# Patient Record
Sex: Male | Born: 1950 | Race: White | Hispanic: No | Marital: Married | Smoking: Former smoker
Health system: Southern US, Community
[De-identification: ages and names within clinical notes are randomized; demographics above are authoritative.]

## PROBLEM LIST (undated history)

## (undated) DIAGNOSIS — I10 Essential (primary) hypertension: Secondary | ICD-10-CM

## (undated) DIAGNOSIS — K219 Gastro-esophageal reflux disease without esophagitis: Secondary | ICD-10-CM

## (undated) HISTORY — PX: COLONOSCOPY: SHX174

---

## 2004-05-22 ENCOUNTER — Ambulatory Visit: Payer: Self-pay | Admitting: Internal Medicine

## 2004-05-22 ENCOUNTER — Ambulatory Visit (HOSPITAL_COMMUNITY): Admission: RE | Admit: 2004-05-22 | Discharge: 2004-05-22 | Payer: Self-pay | Admitting: Internal Medicine

## 2005-08-28 ENCOUNTER — Ambulatory Visit: Payer: Self-pay | Admitting: Internal Medicine

## 2007-05-09 ENCOUNTER — Emergency Department (HOSPITAL_COMMUNITY): Admission: EM | Admit: 2007-05-09 | Discharge: 2007-05-09 | Payer: Self-pay | Admitting: Emergency Medicine

## 2007-05-10 ENCOUNTER — Inpatient Hospital Stay (HOSPITAL_COMMUNITY): Admission: EM | Admit: 2007-05-10 | Discharge: 2007-05-12 | Payer: Self-pay | Admitting: Emergency Medicine

## 2010-07-17 NOTE — Op Note (Signed)
NAME:  Javier Lee, Javier Lee            ACCOUNT NO.:  [id number]   MEDICAL RECORD NO.:  [id number]          PATIENT TYPE:  INP   LOCATION:  5041                         FACILITY:  [facility]   PHYSICIAN:  John C. Madilyn Fireman, M.D.    DATE OF BIRTH:  [birth year]   DATE OF PROCEDURE:  05/10/2007  DATE OF DISCHARGE:                               OPERATIVE REPORT   PROCEDURE:  Esophagogastroduodenoscopy.   INDICATIONS FOR PROCEDURE:  Gastrointestinal bleeding after  esophagogastroduodenoscopy with removal of foreign body from the  esophagus yesterday.   PROCEDURE:  The patient was placed in the left lateral decubitus  position and placed on the pulse monitor with continuous low-flow oxygen  delivered by nasal cannula.  He was sedated with 75 mcg IV ___________ ,  6 mg IV Versed and 25 mg IV Phenergan.  The Olympus video endoscope was  advanced under direct vision into the oropharynx and esophagus.  The  esophagus was straight and of normal caliber with the squamocolumnar  line at 38 cm.  There was a ring seen as before.  The stomach was  entered and there was a lot of black old blood in the dependent portions  of fundus and one or two blackish clots.  There was no red blood seen.  The scope was advanced down into the distal stomach where the mucosa was  better seen.  There was no distal ulcer, pylorus was not deformed and  easily allowed passage of the endoscope tip into the duodenum.  Both  bulb and second portion were inspected and appeared to be within normal  limits.  Scope was drawn back into the stomach and attention was given  toward the GE junction and proximal stomach.  A lot of suctioning and  lavage was obtained and there was some blackish material, some of which  was adherent to the stomach wall which made visualization of fine detail  difficult.  However, no active bleeding was seen on prograde and  retroflexed view of the cardia.  I did not distinctly see a bleeding  lesion even though  there was a small area between the hiatus and the  ring that was somewhat difficult to see and I suspected there was  probably a laceration from yesterday's procedure.  However, no bleeding  was seen and no adherent blood clot or visible vessel was seen either.  Since that there appeared to be no further active bleeding, no therapy  was performed.  The scope was then withdrawn and the patient returned to  the recovery room in stable condition.  She tolerated the procedure  well.  There were no immediate complications.   IMPRESSION:  Lower esophageal ring with small hiatal hernia and old  blood in the stomach indicating recent active bleeding probably from  near the gastroesophageal junction.   PLAN:  Will keep him in the hospital overnight for observation to  observe for ongoing bleeding.  Otherwise treat with proton pump  inhibitor.           ______________________________  Everardo All. Madilyn Fireman, M.D.     JCH/MEDQ  D:  05/10/2007  T:  05/11/2007  Job:  [id number]

## 2010-07-17 NOTE — Op Note (Signed)
NAME:  STEVENS, MAGWOOD NO.:  [id number]   MEDICAL RECORD NO.:  [id number]          PATIENT TYPE:  EMS   LOCATION:  MAJO                         FACILITY:  [facility]   PHYSICIAN:  John C. Madilyn Fireman, M.D.    DATE OF BIRTH:  06-24-1950   DATE OF PROCEDURE:  05/09/2007  DATE OF DISCHARGE:                               OPERATIVE REPORT   PROCEDURE:  Esophagogastroduodenoscopy with foreign body removal.   INDICATIONS FOR PROCEDURE:  Sensation of food impaction with inability  to swallow liquids or saliva since eating steak yesterday evening.   HISTORY OF PRESENT ILLNESS:  The patient is a 60 year old white male in  good health except for hypertension but who has had intermittent solid  food dysphagia in the past and has had an esophageal dilatation about 5  years ago.  He has had worsening intermittent solid food dysphagia  recently and had a sensation of complete impaction after eating steak  last night which persisted through night and did not respond to  glucagon.  He has spitting in the emesis basin since he arrived at the  emergency room.   PROCEDURE:  The patient was placed in the left lateral decubitus  position and placed on the pulse monitor with continuous low-flow oxygen  delivered by nasal cannula.  He was sedated with 100 mcg IV fentanyl and  10 mg IV Versed and 12.5 mcg IV fentanyl.  The Olympus video endoscope  was advanced under direct vision in the oropharynx and esophagus.  The  esophagus was straight and of normal caliber.  The distal esophagus was  obscured by significant food bolus.  This was pushed through the GE  junction with gentle pressure which did result in some bleeding and  revealed an underlying friable lower esophageal ring with a short hiatal  hernia.  The stomach was entered, and a small amount of liquid  secretions as well as the food that had been pushed through was seen.  I  did not approach the pylorus.  I did not see any evidence of  malignancy  and did not see any ulcers.  No dilatation was done.  The scope was then  withdrawn and the patient returned to the recovery room in stable  condition.  He tolerated the procedure well, and there were no immediate  complications.   IMPRESSION:  1. Foreign body in the esophagus dislodged.  2. Lower esophageal ring.   PLAN:  Continue proton pump inhibitor.  We will see in the office in 3-4  weeks and set up elective dilatation.           ______________________________  Everardo All Madilyn Fireman, M.D.     JCH/MEDQ  D:  05/09/2007  T:  05/11/2007  Job:  [id number]

## 2010-07-17 NOTE — H&P (Signed)
NAME:  Javier Lee, Javier Lee            ACCOUNT NO.:  [id number]   MEDICAL RECORD NO.:  [id number]          PATIENT TYPE:  INP   LOCATION:  5041                         FACILITY:  [facility]   PHYSICIAN:  John C. Madilyn Fireman, M.D.    DATE OF BIRTH:  1951/02/20   DATE OF ADMISSION:  05/10/2007  DATE OF DISCHARGE:                              HISTORY & PHYSICAL   CHIEF COMPLAINT:  Hematemesis.   HISTORY OF PRESENT ILLNESS:  The patient is a 60 year old white male who  presented with a food impaction in his esophagus from steak yesterday.  This was disimpacted by pushing the bolus through the GE junction with  an underlying esophageal ring seen.  Later that evening, he  began  having nausea, weakness and a near syncopal episode and black stools.  He came to the emergency room today where he was found to have elevated  BUN of 49, creatinine 0.88 with a hemoglobin of 12.5.  he also had  orthostatic pulse and blood pressure changes with pulse going from 89-  145 when standing.  EGD today showed old coffee ground material in the  stomach with irritation at the GE junction with no active bleeding.  No  other obvious bleeding source.  He is being admitted for further care of  GI bleed.   PAST MEDICAL HISTORY:  1. Hypertension.  2. Anxiety.  3. Gastroesophageal reflux.   MEDICATIONS:  Prilosec, Toprol XL, Vytorin.   FAMILY HISTORY:  Noncontributory.   PHYSICAL EXAMINATION:  HEART:  Regular rate and rhythm with sinus  tachycardia.  ABDOMEN:  Soft, nondistended with normoactive bowel sounds, no  hepatosplenomegaly, mass or guarding.   LABORATORY DATA:  As mentioned above.   IMPRESSION:  Gastrointestinal bleeding related to the esophageal food  impaction and therapy to dislodge it.   PLAN:  Observation admission for now.  Will treat with proton pump  inhibitor, monitor for further bleeding and transfuse as necessary.           ______________________________  Everardo All Madilyn Fireman, M.D.     JCH/MEDQ   D:  05/10/2007  T:  05/11/2007  Job:  [id number]

## 2010-07-20 NOTE — Op Note (Signed)
NAME:  Javier Lee, Javier Lee            ACCOUNT NO.:  [id number]   MEDICAL RECORD NO.:  [id number]          PATIENT TYPE:  AMB   LOCATION:  DAY                           FACILITY:  [facility]   PHYSICIAN:  R. Roetta Sessions, M.D. DATE OF BIRTH:  05-16-1950   DATE OF PROCEDURE:  05/22/2004  DATE OF DISCHARGE:                                 OPERATIVE REPORT   PROCEDURE:  Esophagogastroduodenoscopy with Elease Hashimoto dilation.   INDICATIONS FOR PROCEDURE:  The patient is 60 year old gentleman with  history of GERD and esophageal dysphagia, known history of Schatzki's ring  who underwent dilation by me at [facility] back in 1998. He has done  well as far as his reflux symptoms being controlled on Prilosec 20 mg orally  daily. He has had recurrent intermittent esophageal dysphagia to solids. EGD  is now being done. This approach has been discussed with patient at length  along with potential risks, benefits, and alternatives. His questions were  answered. He is agreeable. Please see documentation in the medical record.   PROCEDURE:  O2 saturation, blood pressure, pulse, and respirations were  monitored throughout the entire procedure. Conscious sedation with Versed 5  mg IV and Demerol 125 mg in divided doses.   INSTRUMENT:  Olympus video chip system.   FINDINGS:  Examination of the tubular esophagus revealed a Schatzki's ring.  It was not critical appearing, but it was very much present. Esophageal  mucosa otherwise appeared normal. EG junction easily transversed with a  scope.   Stomach:  The gastric cavity was empty and insufflated well with air.  Thorough examination of the gastric mucosa including retroflexed view of the  proximal stomach and esophagogastric junction demonstrated only a moderate  sized hiatal hernia. Pylorus was patent and easily transversed. Examination  of bulb and second portion revealed no abnormalities.   THERAPY/DIAGNOSTIC MANEUVERS:  A 58-French Maloney dilator is  passed to full  insertion. A look back revealed persistence of the ring. It was disrupted  with four quadrant bites with the biopsy forceps. The patient tolerated the  procedure well and was reactive in endoscopy.   IMPRESSION:  1.  Schatzki's ring, otherwise normal esophagus, status post      dilation/disruption as described above.  2.  Moderate sized hiatal hernia, otherwise normal stomach, normal D1 and      D2.   RECOMMENDATIONS:  1.  Continue Prilosec 20 mg orally daily.  2.  Anti-reflux measures/literature provided.  3.  Follow up on a p.r.n. basis.      RMR/MEDQ  D:  05/22/2004  T:  05/22/2004  Job:  [id number]

## 2010-11-26 LAB — I-STAT 8, (EC8 V) (CONVERTED LAB)
Bicarbonate: 25 — ABNORMAL HIGH
HCT: 52
Hemoglobin: 17.7 — ABNORMAL HIGH
Operator id: 133351
Sodium: 137
TCO2: 26
pCO2, Ven: 34.7 — ABNORMAL LOW

## 2010-11-26 LAB — CBC
HCT: 26.8 — ABNORMAL LOW
HCT: 36 — ABNORMAL LOW
Hemoglobin: 12.5 — ABNORMAL LOW
Hemoglobin: 16.7
Hemoglobin: 9.3 — ABNORMAL LOW
MCHC: 34.5
MCHC: 34.7
MCV: 87.7
Platelets: 183
Platelets: 184
Platelets: 202
Platelets: 230
RBC: 3.05 — ABNORMAL LOW
RBC: 3.27 — ABNORMAL LOW
RDW: 12.7
RDW: 12.7
WBC: 4.8
WBC: 9.3

## 2010-11-26 LAB — BASIC METABOLIC PANEL
BUN: 25 — ABNORMAL HIGH
BUN: 49 — ABNORMAL HIGH
CO2: 26
Calcium: 9.1
Chloride: 105
Creatinine, Ser: 0.87
GFR calc Af Amer: >60
GFR calc Af Amer: >60
GFR calc non Af Amer: >60
GFR calc non Af Amer: >60
GFR calc non Af Amer: >60
Glucose, Bld: 124 — ABNORMAL HIGH
Potassium: 4.1
Sodium: 140
Sodium: 140

## 2010-11-26 LAB — DIFFERENTIAL
Basophils Absolute: 0
Basophils Absolute: 0
Basophils Relative: 0
Basophils Relative: 0
Eosinophils Relative: 0
Lymphocytes Relative: 10 — ABNORMAL LOW
Lymphocytes Relative: 15
Monocytes Absolute: 0.5
Monocytes Absolute: 0.6
Neutro Abs: 8 — ABNORMAL HIGH
Neutro Abs: 9.9 — ABNORMAL HIGH
Neutrophils Relative %: 79 — ABNORMAL HIGH

## 2010-11-26 LAB — POCT I-STAT CREATININE
Creatinine, Ser: 0.9
Operator id: 133351

## 2010-11-26 LAB — OCCULT BLOOD X 1 CARD TO LAB, STOOL: Fecal Occult Bld: POSITIVE

## 2020-07-03 ENCOUNTER — Emergency Department (HOSPITAL_COMMUNITY)
Admission: EM | Admit: 2020-07-03 | Discharge: 2020-07-03 | Disposition: A | Discharge status: Home / Self Care | Payer: Medicare PPO | Attending: Emergency Medicine | Admitting: Emergency Medicine

## 2020-07-03 ENCOUNTER — Other Ambulatory Visit: Payer: Self-pay

## 2020-07-03 ENCOUNTER — Emergency Department (HOSPITAL_COMMUNITY): Payer: Medicare PPO

## 2020-07-03 ENCOUNTER — Encounter (HOSPITAL_COMMUNITY): Payer: Self-pay | Admitting: *Deleted

## 2020-07-03 DIAGNOSIS — M542 Cervicalgia: Secondary | ICD-10-CM | POA: Diagnosis not present

## 2020-07-03 DIAGNOSIS — I10 Essential (primary) hypertension: Secondary | ICD-10-CM | POA: Diagnosis not present

## 2020-07-03 DIAGNOSIS — S40022A Contusion of left upper arm, initial encounter: Secondary | ICD-10-CM | POA: Insufficient documentation

## 2020-07-03 DIAGNOSIS — W109XXA Fall (on) (from) unspecified stairs and steps, initial encounter: Secondary | ICD-10-CM | POA: Diagnosis not present

## 2020-07-03 DIAGNOSIS — S0990XA Unspecified injury of head, initial encounter: Secondary | ICD-10-CM | POA: Diagnosis not present

## 2020-07-03 DIAGNOSIS — F0781 Postconcussional syndrome: Secondary | ICD-10-CM | POA: Diagnosis not present

## 2020-07-03 DIAGNOSIS — S4992XA Unspecified injury of left shoulder and upper arm, initial encounter: Secondary | ICD-10-CM | POA: Insufficient documentation

## 2020-07-03 DIAGNOSIS — W19XXXA Unspecified fall, initial encounter: Secondary | ICD-10-CM

## 2020-07-03 DIAGNOSIS — Y92009 Unspecified place in unspecified non-institutional (private) residence as the place of occurrence of the external cause: Secondary | ICD-10-CM

## 2020-07-03 HISTORY — DX: Essential (primary) hypertension: I10

## 2020-07-03 HISTORY — DX: Gastro-esophageal reflux disease without esophagitis: K21.9

## 2020-07-03 NOTE — ED Provider Notes (Signed)
[facility] EMERGENCY DEPARTMENT Provider Note   CSN: [id number] Arrival date & time: 07/03/20  1316     History Chief Complaint  Patient presents with  . Fall    Javier Lee is a 70 y.o. male.  HPI      Javier Lee is a 70 y.o. male who presents to the Emergency Department complaining of headache, dizziness, swelling of his left temple, and left shoulder and arm pain.  Symptoms have been present for 4 days and secondary to mechanical fall.  He states that he tripped and fell approximately 2 feet down a step in his basement and landed on his left shoulder and left side of his head.  Noticed a small area of swelling along his temple.  The following day, he noticed some brief episodes of dizziness associated with sudden vision change.  Dizziness lasting"just a few seconds."  He denies LOC, nausea, vomiting, visual changes, chest pain or rib pain.  He also describes having some soreness along his upper back and left shoulder.  NO numbness or weakness of his extremities.  He was seen by his PCP earlier today and advised to come to the emergency department for further evaluation and CT of his head.  He does not take any anticoagulants.    Past Medical History:  Diagnosis Date  . GERD (gastroesophageal reflux disease)   . Hypertension     There are no problems to display for this patient.   Past Surgical History:  Procedure Laterality Date  . COLONOSCOPY       No family history on file.  Social History   Tobacco Use  . Smoking status: Never Smoker  . Smokeless tobacco: Never Used  Substance Use Topics  . Alcohol use: Yes  . Drug use: Never    Home Medications Prior to Admission medications   Not on File    Allergies    Tricyclic antidepressants  Review of Systems   Review of Systems  Constitutional: Negative for activity change, appetite change and fever.  HENT: Negative for facial swelling and trouble swallowing.   Eyes: Negative for photophobia,  pain and visual disturbance.  Respiratory: Negative for shortness of breath.   Cardiovascular: Negative for chest pain.  Gastrointestinal: Negative for abdominal pain, nausea and vomiting.  Musculoskeletal: Positive for arthralgias (Left shoulder, left upper arm pain) and neck pain. Negative for neck stiffness.  Skin: Negative for rash and wound.  Neurological: Positive for headaches. Negative for dizziness, facial asymmetry, speech difficulty, weakness and numbness.  Psychiatric/Behavioral: Negative for confusion and decreased concentration.    Physical Exam Updated Vital Signs BP (!) 134/92   Pulse 69   Temp 98.2 F (36.8 C) (Oral)   Resp 18   SpO2 98%   Physical Exam Vitals and nursing note reviewed.  Constitutional:      General: He is not in acute distress.    Appearance: Normal appearance. He is not ill-appearing.  HENT:     Head:     Comments: Focal tenderness palpation of the left temple region.  Small hematoma present.  No ecchymosis.  No facial tenderness.    Mouth/Throat:     Mouth: Mucous membranes are moist.  Eyes:     Extraocular Movements: Extraocular movements intact.     Conjunctiva/sclera: Conjunctivae normal.     Pupils: Pupils are equal, round, and reactive to light.  Neck:     Comments: Tenderness palpation of the bilateral cervical paraspinal muscles.  Left greater than right.  Mild midline tenderness, no bony step-offs. Cardiovascular:     Rate and Rhythm: Normal rate and regular rhythm.     Pulses: Normal pulses.  Pulmonary:     Effort: Pulmonary effort is normal.     Breath sounds: Normal breath sounds.  Chest:     Chest wall: No tenderness.  Abdominal:     Palpations: Abdomen is soft.  Musculoskeletal:        General: Tenderness and signs of injury present. No swelling or deformity.     Cervical back: Normal range of motion. Tenderness present.     Comments: Pt has FROM of the left shoulder and elbow.  Ecchymosis of the lateral left upper arm.   No hematoma  Skin:    General: Skin is warm.     Capillary Refill: Capillary refill takes less than 2 seconds.  Neurological:     General: No focal deficit present.     Mental Status: He is alert.     Sensory: No sensory deficit.     Motor: No weakness.     ED Results / Procedures / Treatments   Labs (all labs ordered are listed, but only abnormal results are displayed) Labs Reviewed - No data to display  EKG None  Radiology CT Head Wo Contrast  Result Date: 07/03/2020 CLINICAL DATA:  Status post fall on 04/28. Patient complains of left shoulder, neck and head pain EXAM: CT HEAD WITHOUT CONTRAST CT CERVICAL SPINE WITHOUT CONTRAST TECHNIQUE: Multidetector CT imaging of the head and cervical spine was performed following the standard protocol without intravenous contrast. Multiplanar CT image reconstructions of the cervical spine were also generated. COMPARISON:  None. FINDINGS: CT HEAD FINDINGS Brain: No evidence of acute infarction, hemorrhage, hydrocephalus, extra-axial collection or mass lesion/mass effect. Prominence of the sulci and ventricles identified compatible with brain atrophy. Vascular: No hyperdense vessel or unexpected calcification. Skull: Normal. Negative for fracture or focal lesion. Sinuses/Orbits: Retention cyst versus polyp is noted in the right maxillary sinus. The remaining paranasal sinuses and mastoid air cells are clear. Other: None. CT CERVICAL SPINE FINDINGS Alignment: Normal. Skull base and vertebrae: No acute fracture. No primary bone lesion or focal pathologic process. Soft tissues and spinal canal: No prevertebral fluid or swelling. No visible canal hematoma. Disc levels: Mild multilevel disc space narrowing and ventral endplate spurring is noted throughout the cervical spine. Upper chest: Negative. Other: None IMPRESSION: 1. No acute intracranial abnormality. 2. Mild brain atrophy. 3. No cervical spine fracture. 4. Cervical degenerative disc disease.  Electronically Signed   By: Signa Kell M.D.   On: 07/03/2020 16:51   CT Cervical Spine Wo Contrast  Result Date: 07/03/2020 CLINICAL DATA:  Status post fall on 04/28. Patient complains of left shoulder, neck and head pain EXAM: CT HEAD WITHOUT CONTRAST CT CERVICAL SPINE WITHOUT CONTRAST TECHNIQUE: Multidetector CT imaging of the head and cervical spine was performed following the standard protocol without intravenous contrast. Multiplanar CT image reconstructions of the cervical spine were also generated. COMPARISON:  None. FINDINGS: CT HEAD FINDINGS Brain: No evidence of acute infarction, hemorrhage, hydrocephalus, extra-axial collection or mass lesion/mass effect. Prominence of the sulci and ventricles identified compatible with brain atrophy. Vascular: No hyperdense vessel or unexpected calcification. Skull: Normal. Negative for fracture or focal lesion. Sinuses/Orbits: Retention cyst versus polyp is noted in the right maxillary sinus. The remaining paranasal sinuses and mastoid air cells are clear. Other: None. CT CERVICAL SPINE FINDINGS Alignment: Normal. Skull base and vertebrae: No acute fracture. No primary bone  lesion or focal pathologic process. Soft tissues and spinal canal: No prevertebral fluid or swelling. No visible canal hematoma. Disc levels: Mild multilevel disc space narrowing and ventral endplate spurring is noted throughout the cervical spine. Upper chest: Negative. Other: None IMPRESSION: 1. No acute intracranial abnormality. 2. Mild brain atrophy. 3. No cervical spine fracture. 4. Cervical degenerative disc disease. Electronically Signed   By: Signa Kell M.D.   On: 07/03/2020 16:51   DG Shoulder Left  Result Date: 07/03/2020 CLINICAL DATA:  Fall with proximal humerus pain EXAM: LEFT SHOULDER - 2+ VIEW COMPARISON:  None. FINDINGS: There is no evidence of fracture or dislocation. There is no evidence of arthropathy or other focal bone abnormality. Soft tissues are unremarkable.  IMPRESSION: Negative. Electronically Signed   By: Jasmine Pang M.D.   On: 07/03/2020 16:46   DG Humerus Left  Result Date: 07/03/2020 CLINICAL DATA:  Fall with proximal humerus pain EXAM: LEFT HUMERUS - 2+ VIEW COMPARISON:  None. FINDINGS: There is no evidence of fracture or other focal bone lesions. Soft tissues are unremarkable. IMPRESSION: Negative. Electronically Signed   By: Jasmine Pang M.D.   On: 07/03/2020 16:46    Procedures Procedures   Medications Ordered in ED Medications - No data to display  ED Course  I have reviewed the triage vital signs and the nursing notes.  Pertinent labs & imaging results that were available during my care of the patient were reviewed by me and considered in my medical decision making (see chart for details).    MDM Rules/Calculators/A&P                         Patient here under advisement of PCP for evaluation of a fall that occurred 4 days ago.  Injury of the left shoulder and left side of his head.  No LOC, do not take blood thinners.  Patient has focal tenderness and small area of swelling along the left temple area.  On exam, no focal neurodeficits.  He is ambulatory with steady gait.   On recheck, patient sitting upright on the stretcher.  Discussed imaging results.  Symptoms felt to be likely related to post concussive syndrome.  He is ambulatory with steady gait.  No focal neuro deficits on his exam.  I feel that he is appropriate for discharge home.  Given head injury instructions and he is agreeable to take Tylenol if needed for pain.  He will follow-up with his PCP later this week.  Return precautions were discussed.    Final Clinical Impression(s) / ED Diagnoses Final diagnoses:  Fall in home, initial encounter  Post concussive syndrome    Rx / DC Orders ED Discharge Orders    None       Pauline Aus, PA-C 07/03/20 1750    Long, Arlyss Repress, MD 07/04/20 1054

## 2020-07-03 NOTE — ED Triage Notes (Signed)
Emergency Medicine Provider Triage Evaluation Note  Javier Lee , a 70 y.o. male  was evaluated in triage.  Pt complains of chemical fall that occurred 4 days ago.  States that he fell approximately 2 feet down a step in his basement.  States that he landed on his left shoulder and left side of his head.  No LOC.  He was able to get up without assistance.  He did not seek medical treatment at the time of the injury.  Following day, he noticed having some dizziness upon position change.  He also describes having soreness to the left side of his neck, and upper back.  He was seen by his PCP this morning and advised to come to the emergency department for further evaluation.  Review of Systems  Positive: Left shoulder pain, left head pain, dizziness Negative: LOC, vomiting, visual changes  Physical Exam  BP (!) 134/92   Pulse 69   Temp 98.2 F (36.8 C) (Oral)   Resp 18   SpO2 98%  Gen:   Awake, no distress   HEENT:  Pain swelling left temple Resp:  Normal effort  Cardiac:  Normal rate and rhythm Abd:   Nondistended, nontender  MSK:   Tender to anterior left shoulder.  Bruising to mid left upper arm.  No hematoma Neuro:  Speech clear   Medical Decision Making  Medically screening exam initiated at 2:54 PM.  Appropriate orders placed.  Javier Lee was informed that the remainder of the evaluation will be completed by another provider, this initial triage assessment does not replace that evaluation, and the importance of remaining in the ED until their evaluation is complete.  Clinical Impression    Patient here for evaluation of mechanical fall that occurred 4 days ago.  Fell landing on his left shoulder and left head.  No LOC.  Seen by PCP earlier this morning and advised to come to the emergency department for further evaluation.  He will need CT imaging of the head and C-spine.  Agreeable to stay for further evaluation.   Pauline Aus, PA-C 07/03/20 1500

## 2020-07-03 NOTE — Discharge Instructions (Addendum)
Your x-rays and CT imaging today were reassuring.  I recommend that you apply ice packs on and off to help with swelling.  You may take Tylenol if needed for pain.  Avoid excessive activity or operating heavy machinery for this week.  Call your primary care provider to arrange a recheck.  Return to emergency department if you develop any new or worsening symptoms.

## 2020-07-03 NOTE — ED Triage Notes (Signed)
Fell on 4/28. Pain in left shoulder neck and head. Referred by PCP for imaging

## 2021-01-16 DIAGNOSIS — Z23 Encounter for immunization: Secondary | ICD-10-CM | POA: Diagnosis not present

## 2021-01-22 DIAGNOSIS — Z1389 Encounter for screening for other disorder: Secondary | ICD-10-CM | POA: Diagnosis not present

## 2021-01-22 DIAGNOSIS — I10 Essential (primary) hypertension: Secondary | ICD-10-CM | POA: Diagnosis not present

## 2021-01-22 DIAGNOSIS — Z1331 Encounter for screening for depression: Secondary | ICD-10-CM | POA: Diagnosis not present

## 2021-01-22 DIAGNOSIS — E7801 Familial hypercholesterolemia: Secondary | ICD-10-CM | POA: Diagnosis not present

## 2021-01-22 DIAGNOSIS — Z6826 Body mass index (BMI) 26.0-26.9, adult: Secondary | ICD-10-CM | POA: Diagnosis not present

## 2021-01-22 DIAGNOSIS — K219 Gastro-esophageal reflux disease without esophagitis: Secondary | ICD-10-CM | POA: Diagnosis not present

## 2021-01-22 DIAGNOSIS — G4700 Insomnia, unspecified: Secondary | ICD-10-CM | POA: Diagnosis not present

## 2021-05-01 DIAGNOSIS — Z6826 Body mass index (BMI) 26.0-26.9, adult: Secondary | ICD-10-CM | POA: Diagnosis not present

## 2021-05-01 DIAGNOSIS — E7801 Familial hypercholesterolemia: Secondary | ICD-10-CM | POA: Diagnosis not present

## 2021-05-01 DIAGNOSIS — K219 Gastro-esophageal reflux disease without esophagitis: Secondary | ICD-10-CM | POA: Diagnosis not present

## 2021-05-01 DIAGNOSIS — G4700 Insomnia, unspecified: Secondary | ICD-10-CM | POA: Diagnosis not present

## 2021-05-01 DIAGNOSIS — I10 Essential (primary) hypertension: Secondary | ICD-10-CM | POA: Diagnosis not present

## 2021-07-17 DIAGNOSIS — K219 Gastro-esophageal reflux disease without esophagitis: Secondary | ICD-10-CM | POA: Diagnosis not present

## 2021-07-17 DIAGNOSIS — I10 Essential (primary) hypertension: Secondary | ICD-10-CM | POA: Diagnosis not present

## 2021-07-17 DIAGNOSIS — Z1329 Encounter for screening for other suspected endocrine disorder: Secondary | ICD-10-CM | POA: Diagnosis not present

## 2021-07-17 DIAGNOSIS — Z13220 Encounter for screening for lipoid disorders: Secondary | ICD-10-CM | POA: Diagnosis not present

## 2021-07-17 DIAGNOSIS — E7801 Familial hypercholesterolemia: Secondary | ICD-10-CM | POA: Diagnosis not present

## 2021-07-17 DIAGNOSIS — R7309 Other abnormal glucose: Secondary | ICD-10-CM | POA: Diagnosis not present

## 2021-07-17 DIAGNOSIS — E7800 Pure hypercholesterolemia, unspecified: Secondary | ICD-10-CM | POA: Diagnosis not present

## 2021-07-17 DIAGNOSIS — Z131 Encounter for screening for diabetes mellitus: Secondary | ICD-10-CM | POA: Diagnosis not present

## 2021-07-24 DIAGNOSIS — E7801 Familial hypercholesterolemia: Secondary | ICD-10-CM | POA: Diagnosis not present

## 2021-07-24 DIAGNOSIS — Z0001 Encounter for general adult medical examination with abnormal findings: Secondary | ICD-10-CM | POA: Diagnosis not present

## 2021-07-24 DIAGNOSIS — G4700 Insomnia, unspecified: Secondary | ICD-10-CM | POA: Diagnosis not present

## 2021-07-24 DIAGNOSIS — I10 Essential (primary) hypertension: Secondary | ICD-10-CM | POA: Diagnosis not present

## 2021-07-24 DIAGNOSIS — K219 Gastro-esophageal reflux disease without esophagitis: Secondary | ICD-10-CM | POA: Diagnosis not present

## 2021-07-24 DIAGNOSIS — Z6826 Body mass index (BMI) 26.0-26.9, adult: Secondary | ICD-10-CM | POA: Diagnosis not present

## 2022-01-29 DIAGNOSIS — E7801 Familial hypercholesterolemia: Secondary | ICD-10-CM | POA: Diagnosis not present

## 2022-01-29 DIAGNOSIS — I10 Essential (primary) hypertension: Secondary | ICD-10-CM | POA: Diagnosis not present

## 2022-01-29 DIAGNOSIS — G4700 Insomnia, unspecified: Secondary | ICD-10-CM | POA: Diagnosis not present

## 2022-01-29 DIAGNOSIS — Z6826 Body mass index (BMI) 26.0-26.9, adult: Secondary | ICD-10-CM | POA: Diagnosis not present

## 2022-01-29 DIAGNOSIS — K219 Gastro-esophageal reflux disease without esophagitis: Secondary | ICD-10-CM | POA: Diagnosis not present

## 2022-02-26 DIAGNOSIS — K219 Gastro-esophageal reflux disease without esophagitis: Secondary | ICD-10-CM | POA: Diagnosis not present

## 2022-02-26 DIAGNOSIS — G4700 Insomnia, unspecified: Secondary | ICD-10-CM | POA: Diagnosis not present

## 2022-02-26 DIAGNOSIS — Z6827 Body mass index (BMI) 27.0-27.9, adult: Secondary | ICD-10-CM | POA: Diagnosis not present

## 2022-02-26 DIAGNOSIS — I10 Essential (primary) hypertension: Secondary | ICD-10-CM | POA: Diagnosis not present

## 2022-02-26 DIAGNOSIS — Z23 Encounter for immunization: Secondary | ICD-10-CM | POA: Diagnosis not present

## 2022-02-26 DIAGNOSIS — E7801 Familial hypercholesterolemia: Secondary | ICD-10-CM | POA: Diagnosis not present

## 2022-04-02 IMAGING — DX DG SHOULDER 2+V*L*
3 series · 3 of 3 positions shown · non-contrast
Comparison: None.

CLINICAL DATA: Fall with proximal humerus pain

EXAM:
LEFT SHOULDER - 2+ VIEW

[shoulder grashey]
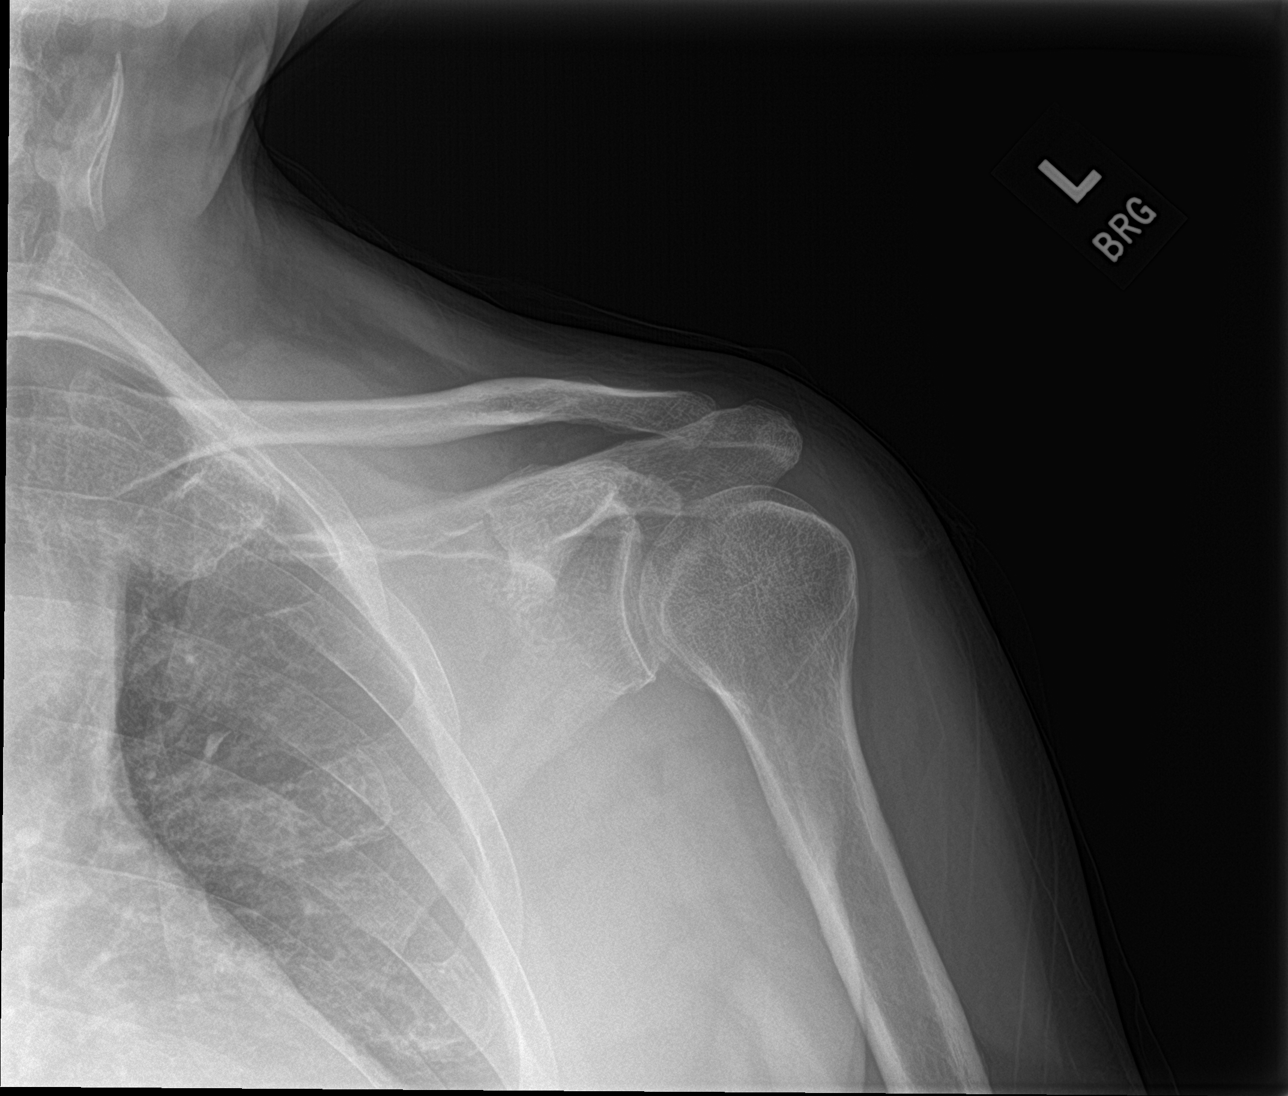

[shoulder y view]
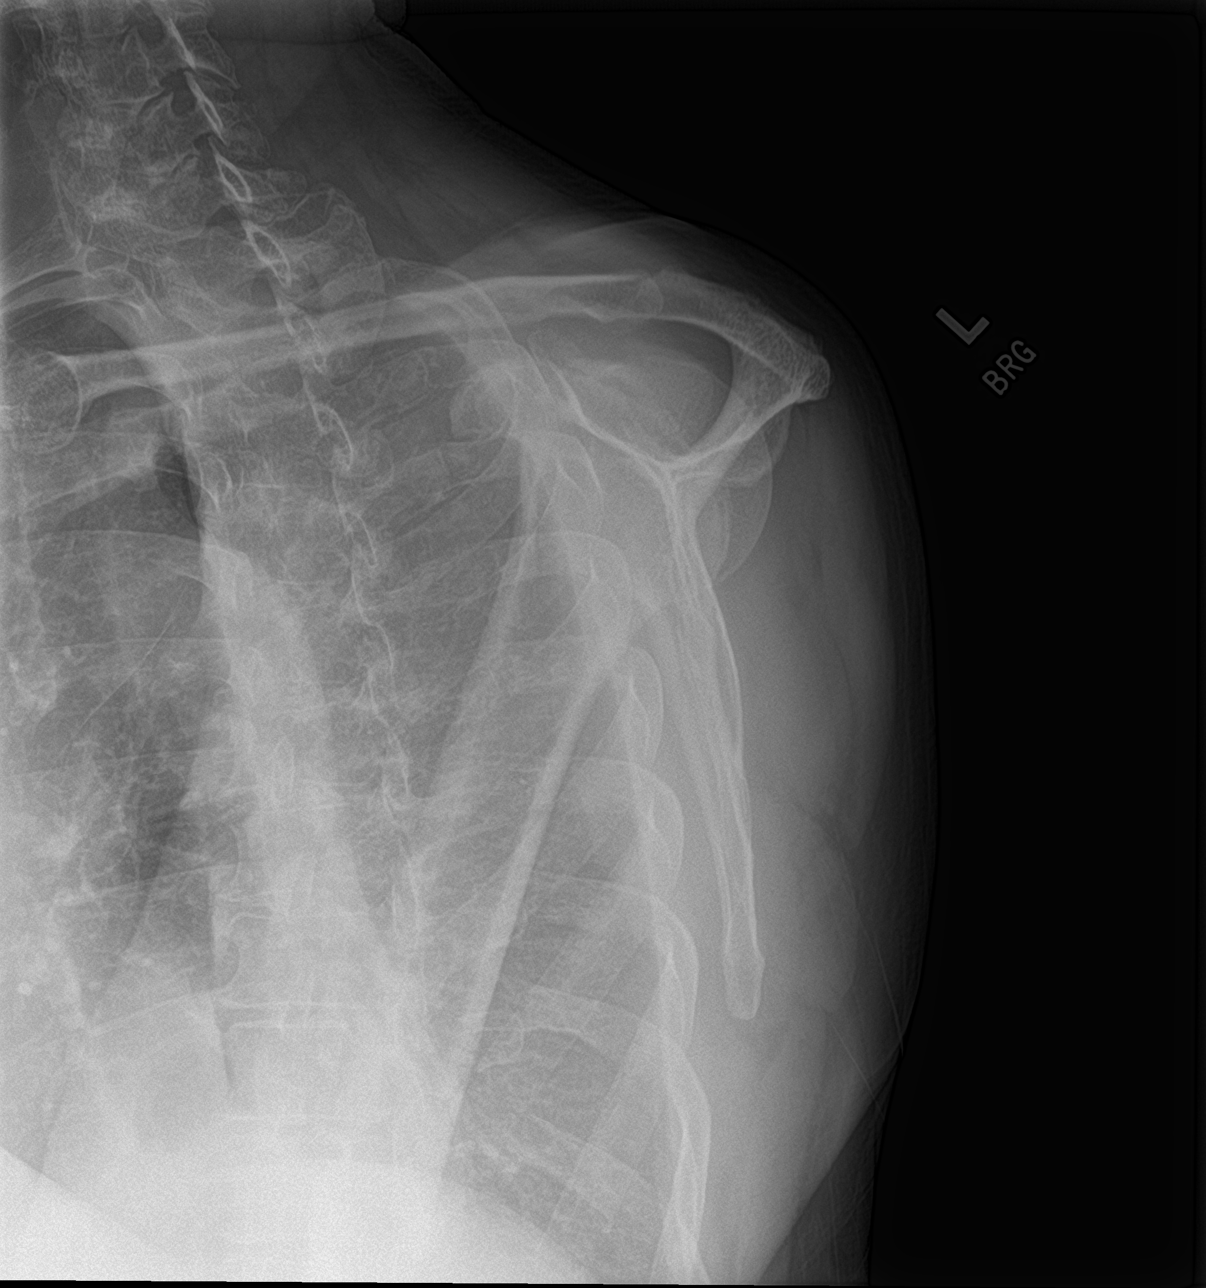

[shoulder axillary]
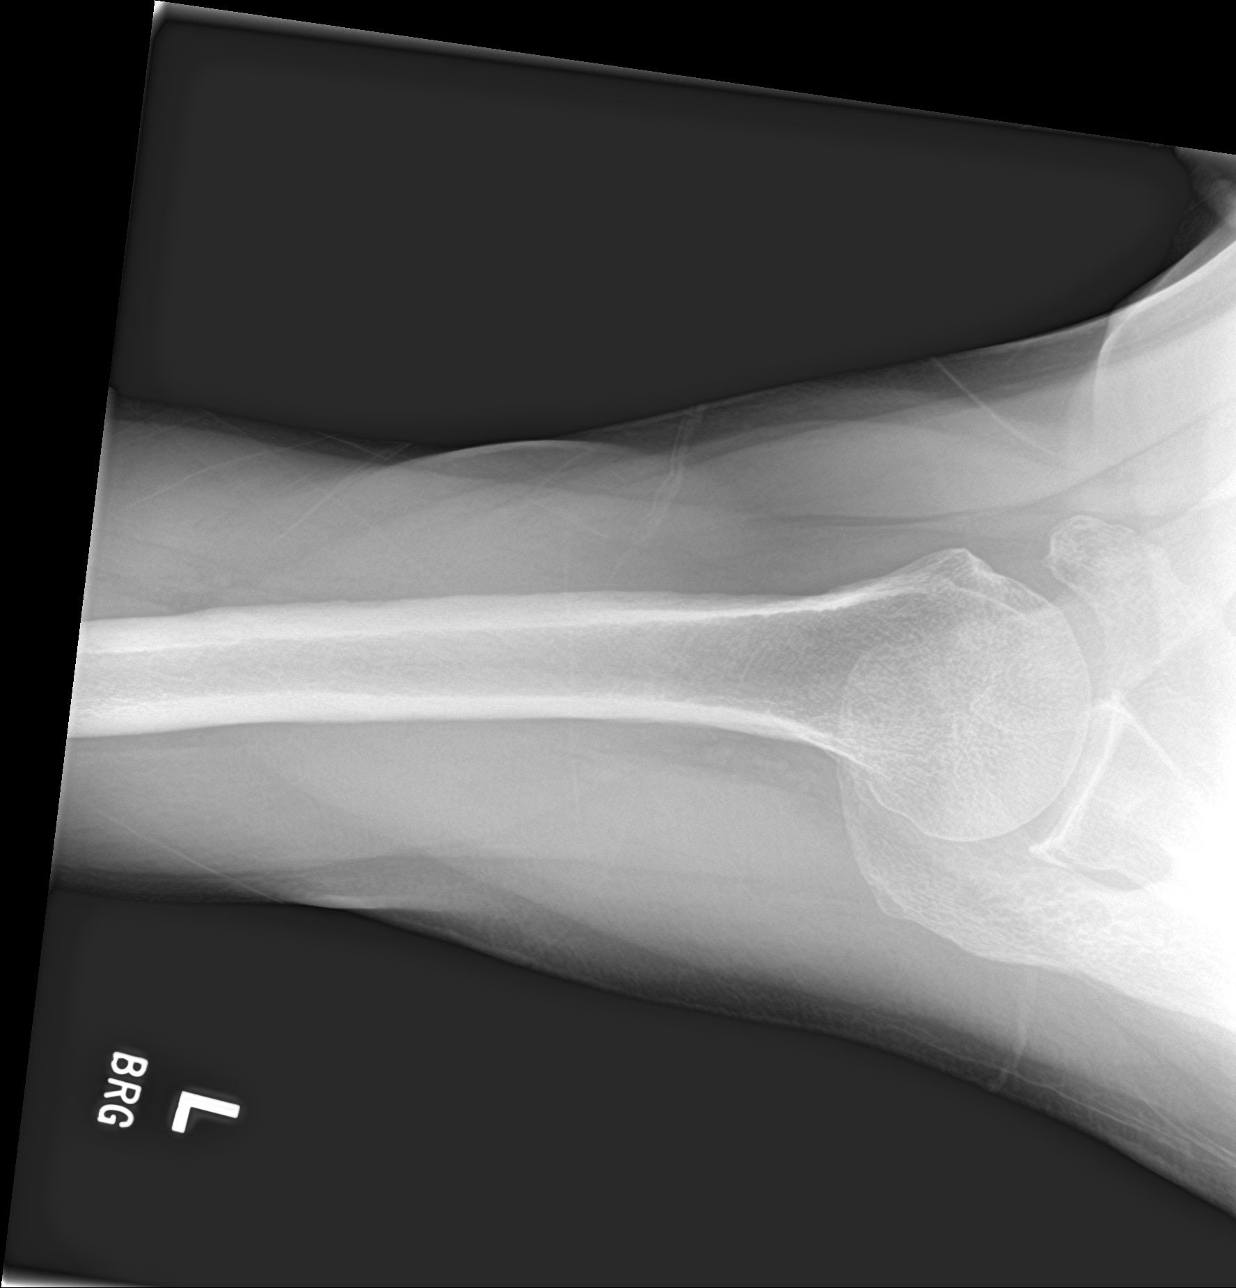

[3 of 3 positions shown; findings below may reference images not displayed]

FINDINGS: There is no evidence of fracture or dislocation. There is no
evidence of arthropathy or other focal bone abnormality. Soft
tissues are unremarkable.
IMPRESSION: Negative.

## 2022-04-02 IMAGING — CT CT CERVICAL SPINE W/O CM
3 of 4 series · 13 of 35 positions shown, 16 images · non-contrast
Comparison: None.

CLINICAL DATA: Status post fall on [DATE]. Patient complains of left
shoulder, neck and head pain

EXAM:
CT HEAD WITHOUT CONTRAST
CT CERVICAL SPINE WITHOUT CONTRAST
TECHNIQUE: Multidetector CT imaging of the head and cervical spine was
performed following the standard protocol without intravenous
contrast. Multiplanar CT image reconstructions of the cervical spine
were also generated.

[Series 5: sagittal bone · sagittal · 0.35mm/px · 5 of 61 slices shown, 6 images]
[im 21/61  bone]
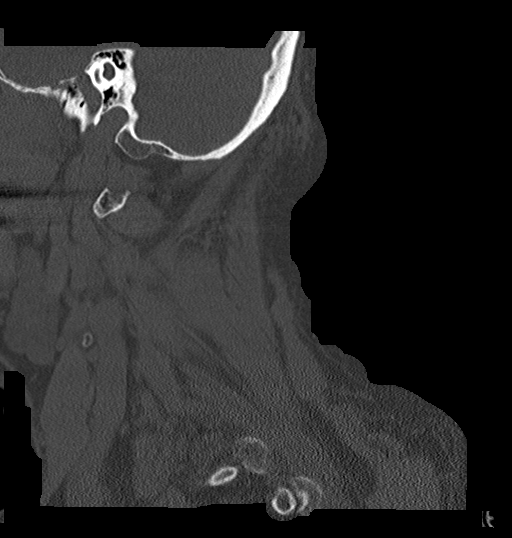
[im 26/61  bone]
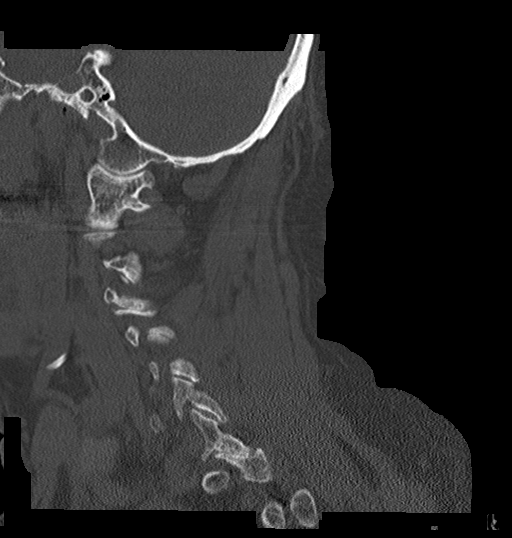
[im 31/61  soft-tissue]
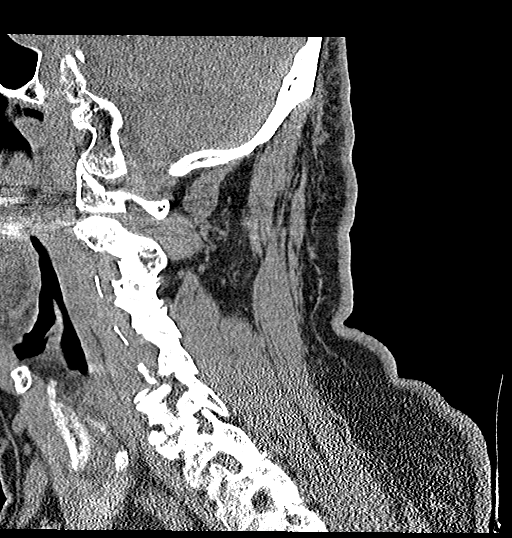
[im 31/61  bone]
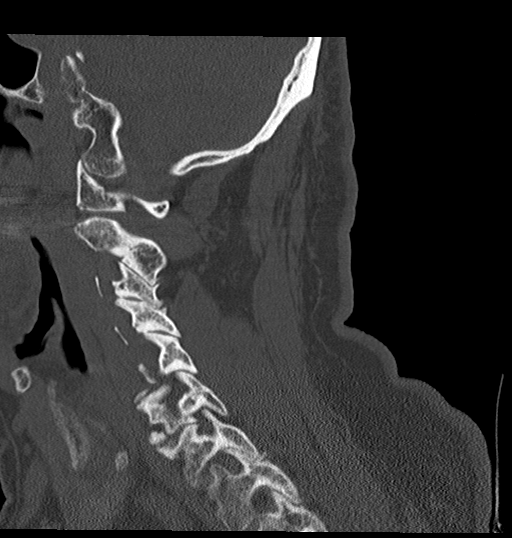
[im 36/61  bone]
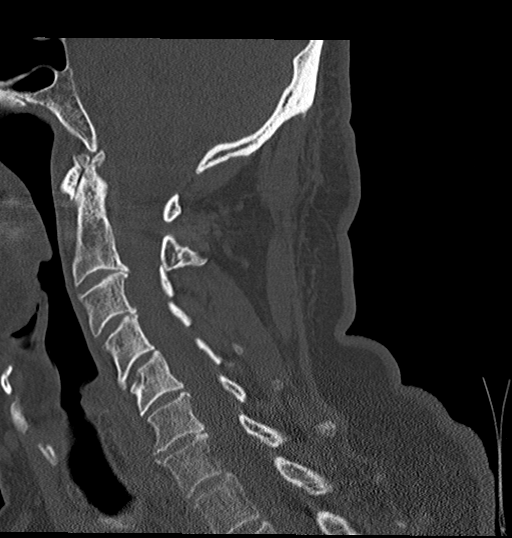
[im 41/61  bone]
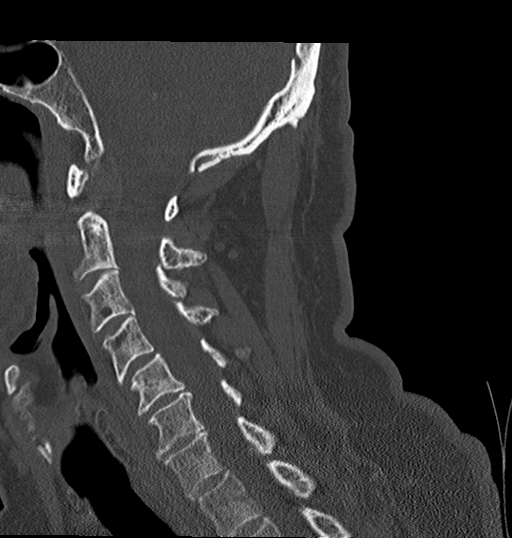

[Series 6: coronal bone · coronal · 0.25mm/px · 3 of 61 slices shown]
[im 14/61  bone]
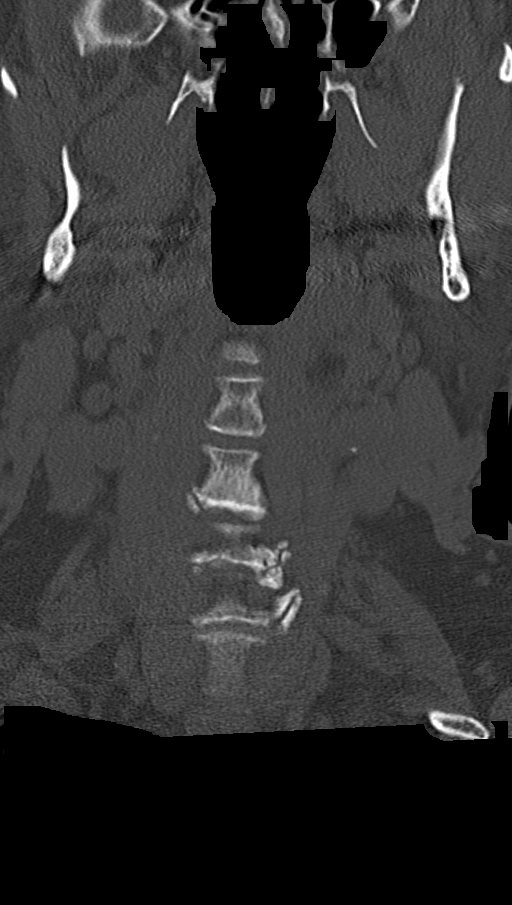
[im 25/61  bone]
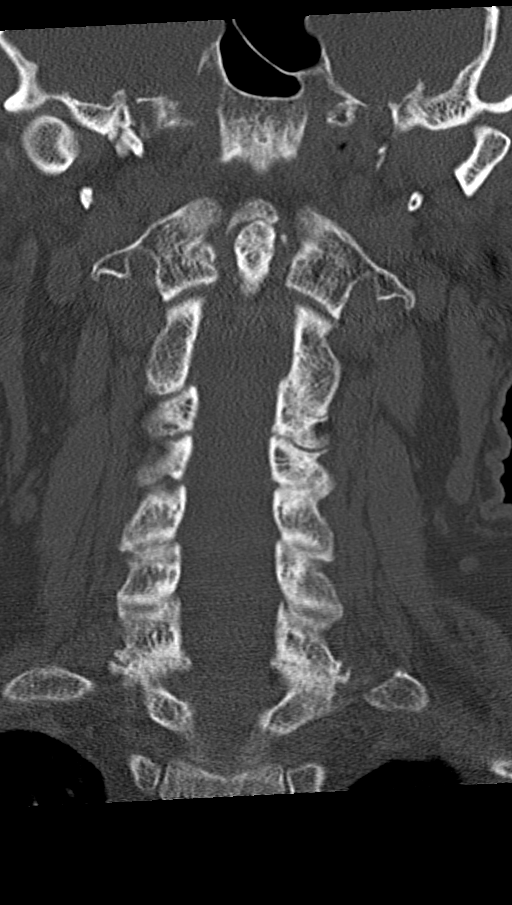
[im 36/61  bone]
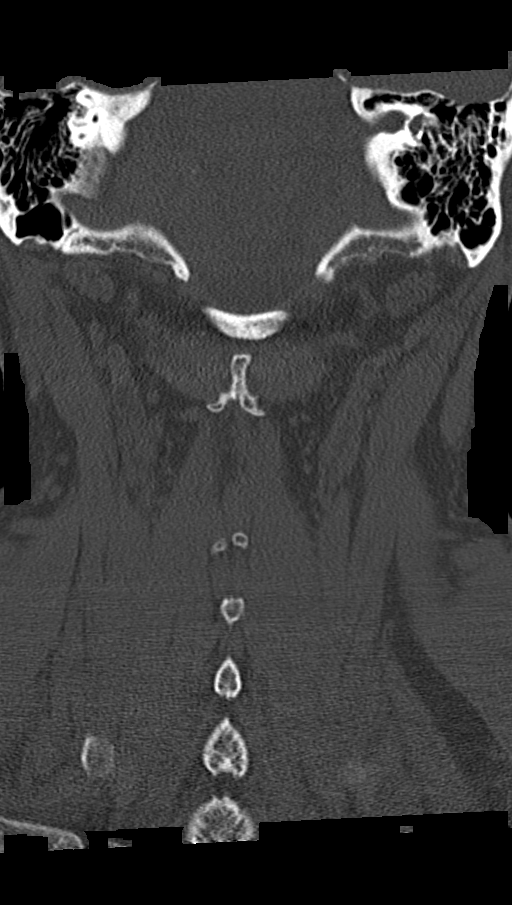

[Series 7: orthogonal axials · axial · 0.21mm/px · z∈[+80,+179]mm · 5 of 80 slices shown, 7 images]
[im 14/80  soft-tissue]
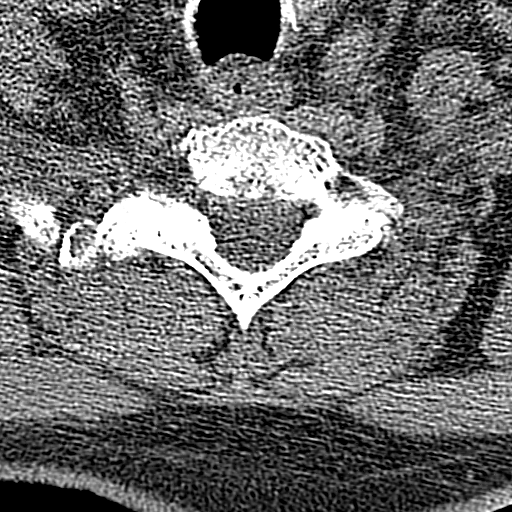
[im 14/80  bone]
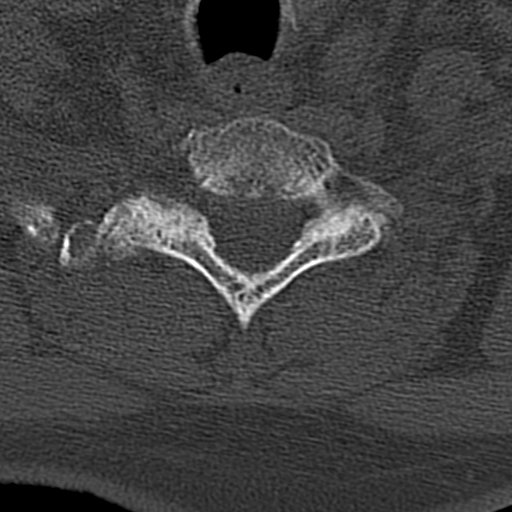
[im 27/80  bone]
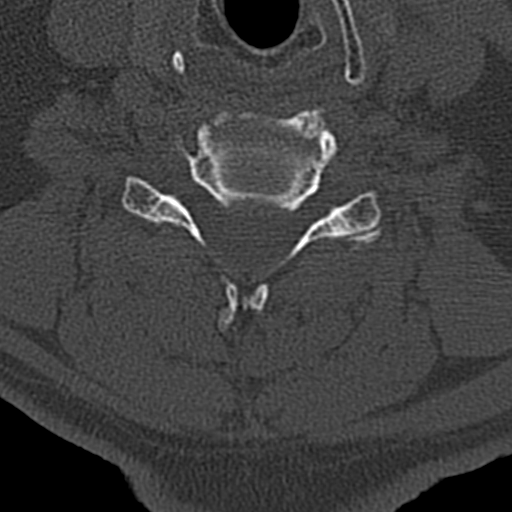
[im 40/80  bone]
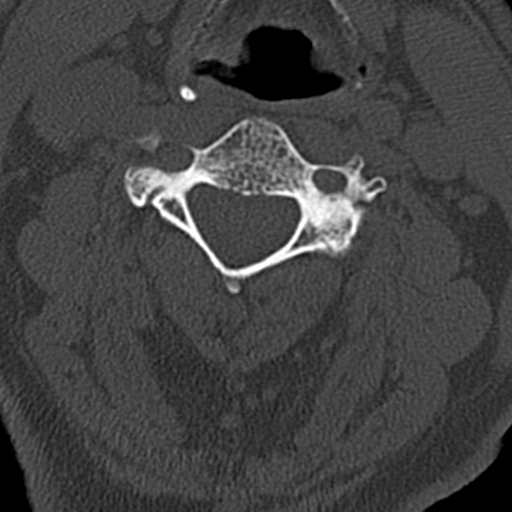
[im 53/80  bone]
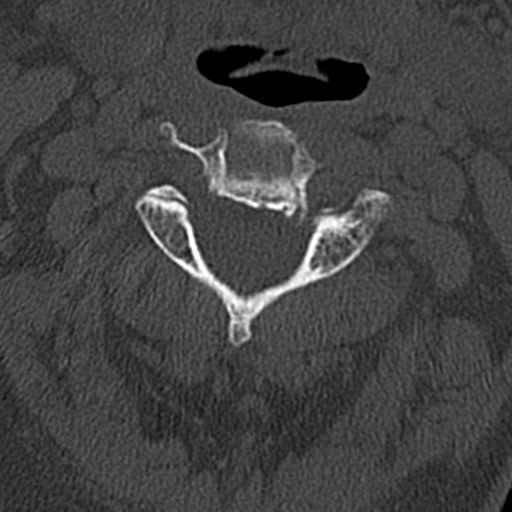
[im 66/80  soft-tissue]
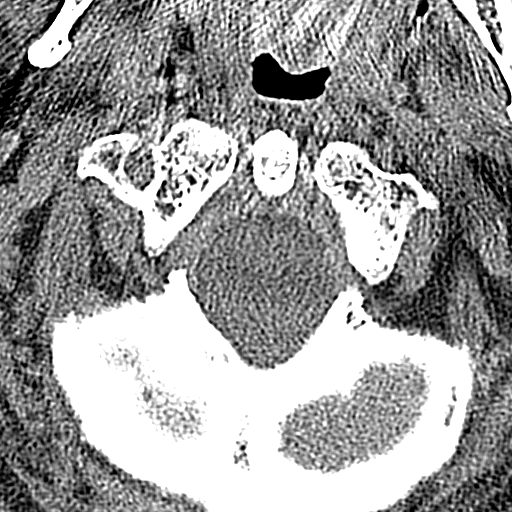
[im 66/80  bone]
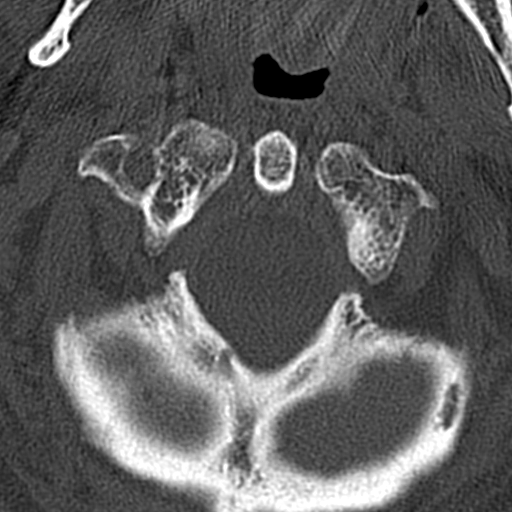

[13 of 35 positions shown; findings below may reference images not displayed]

FINDINGS: CT HEAD FINDINGS

Brain: No evidence of acute infarction, hemorrhage, hydrocephalus,
extra-axial collection or mass lesion/mass effect. Prominence of the
sulci and ventricles identified compatible with brain atrophy.

Vascular: No hyperdense vessel or unexpected calcification.

Skull: Normal. Negative for fracture or focal lesion.

Sinuses/Orbits: Retention cyst versus polyp is noted in the right
maxillary sinus. The remaining paranasal sinuses and mastoid air
cells are clear.

Other: None.

CT CERVICAL SPINE FINDINGS

Alignment: Normal.

Skull base and vertebrae: No acute fracture. No primary bone lesion
or focal pathologic process.

Soft tissues and spinal canal: No prevertebral fluid or swelling. No
visible canal hematoma.

Disc levels: Mild multilevel disc space narrowing and ventral
endplate spurring is noted throughout the cervical spine.

Upper chest: Negative.

Other: None
IMPRESSION: 1. No acute intracranial abnormality.
2. Mild brain atrophy.
3. No cervical spine fracture.
4. Cervical degenerative disc disease.

## 2022-04-19 DIAGNOSIS — H35372 Puckering of macula, left eye: Secondary | ICD-10-CM | POA: Diagnosis not present

## 2022-04-29 DIAGNOSIS — H35342 Macular cyst, hole, or pseudohole, left eye: Secondary | ICD-10-CM | POA: Diagnosis not present

## 2022-04-29 DIAGNOSIS — H33192 Other retinoschisis and retinal cysts, left eye: Secondary | ICD-10-CM | POA: Diagnosis not present

## 2022-04-29 DIAGNOSIS — H35372 Puckering of macula, left eye: Secondary | ICD-10-CM | POA: Diagnosis not present

## 2022-05-15 DIAGNOSIS — H35372 Puckering of macula, left eye: Secondary | ICD-10-CM | POA: Diagnosis not present

## 2022-05-15 DIAGNOSIS — H35342 Macular cyst, hole, or pseudohole, left eye: Secondary | ICD-10-CM | POA: Diagnosis not present

## 2022-05-15 DIAGNOSIS — H43811 Vitreous degeneration, right eye: Secondary | ICD-10-CM | POA: Diagnosis not present

## 2022-07-22 DIAGNOSIS — Z1329 Encounter for screening for other suspected endocrine disorder: Secondary | ICD-10-CM | POA: Diagnosis not present

## 2022-07-22 DIAGNOSIS — E7849 Other hyperlipidemia: Secondary | ICD-10-CM | POA: Diagnosis not present

## 2022-07-22 DIAGNOSIS — E039 Hypothyroidism, unspecified: Secondary | ICD-10-CM | POA: Diagnosis not present

## 2022-07-22 DIAGNOSIS — I10 Essential (primary) hypertension: Secondary | ICD-10-CM | POA: Diagnosis not present

## 2022-07-22 DIAGNOSIS — E1165 Type 2 diabetes mellitus with hyperglycemia: Secondary | ICD-10-CM | POA: Diagnosis not present

## 2022-07-22 DIAGNOSIS — Z125 Encounter for screening for malignant neoplasm of prostate: Secondary | ICD-10-CM | POA: Diagnosis not present

## 2022-07-22 DIAGNOSIS — K219 Gastro-esophageal reflux disease without esophagitis: Secondary | ICD-10-CM | POA: Diagnosis not present

## 2022-08-08 DIAGNOSIS — G4700 Insomnia, unspecified: Secondary | ICD-10-CM | POA: Diagnosis not present

## 2022-08-08 DIAGNOSIS — Z0001 Encounter for general adult medical examination with abnormal findings: Secondary | ICD-10-CM | POA: Diagnosis not present

## 2022-08-08 DIAGNOSIS — E7801 Familial hypercholesterolemia: Secondary | ICD-10-CM | POA: Diagnosis not present

## 2022-08-08 DIAGNOSIS — Z6827 Body mass index (BMI) 27.0-27.9, adult: Secondary | ICD-10-CM | POA: Diagnosis not present

## 2022-08-08 DIAGNOSIS — I10 Essential (primary) hypertension: Secondary | ICD-10-CM | POA: Diagnosis not present

## 2022-08-08 DIAGNOSIS — K219 Gastro-esophageal reflux disease without esophagitis: Secondary | ICD-10-CM | POA: Diagnosis not present

## 2022-10-28 DIAGNOSIS — Z1283 Encounter for screening for malignant neoplasm of skin: Secondary | ICD-10-CM | POA: Diagnosis not present

## 2022-10-28 DIAGNOSIS — D225 Melanocytic nevi of trunk: Secondary | ICD-10-CM | POA: Diagnosis not present

## 2022-11-19 DIAGNOSIS — H35342 Macular cyst, hole, or pseudohole, left eye: Secondary | ICD-10-CM | POA: Diagnosis not present

## 2022-11-19 DIAGNOSIS — H35372 Puckering of macula, left eye: Secondary | ICD-10-CM | POA: Diagnosis not present

## 2022-11-19 DIAGNOSIS — H43811 Vitreous degeneration, right eye: Secondary | ICD-10-CM | POA: Diagnosis not present

## 2023-02-13 DIAGNOSIS — G4700 Insomnia, unspecified: Secondary | ICD-10-CM | POA: Diagnosis not present

## 2023-02-13 DIAGNOSIS — E7801 Familial hypercholesterolemia: Secondary | ICD-10-CM | POA: Diagnosis not present

## 2023-02-13 DIAGNOSIS — I10 Essential (primary) hypertension: Secondary | ICD-10-CM | POA: Diagnosis not present

## 2023-02-13 DIAGNOSIS — K219 Gastro-esophageal reflux disease without esophagitis: Secondary | ICD-10-CM | POA: Diagnosis not present

## 2023-02-13 DIAGNOSIS — Z6828 Body mass index (BMI) 28.0-28.9, adult: Secondary | ICD-10-CM | POA: Diagnosis not present

## 2023-03-28 ENCOUNTER — Ambulatory Visit: Payer: Medicare PPO | Admitting: Orthopedic Surgery

## 2023-04-01 ENCOUNTER — Other Ambulatory Visit (INDEPENDENT_AMBULATORY_CARE_PROVIDER_SITE_OTHER): Payer: Medicare PPO

## 2023-04-01 ENCOUNTER — Ambulatory Visit: Payer: Medicare PPO | Admitting: Orthopedic Surgery

## 2023-04-01 ENCOUNTER — Encounter: Payer: Self-pay | Admitting: Orthopedic Surgery

## 2023-04-01 VITALS — BP 121/81 | HR 60 | Ht 68.0 in | Wt 181.0 lb

## 2023-04-01 DIAGNOSIS — M25561 Pain in right knee: Secondary | ICD-10-CM | POA: Diagnosis not present

## 2023-04-01 DIAGNOSIS — G8929 Other chronic pain: Secondary | ICD-10-CM

## 2023-04-01 NOTE — Patient Instructions (Signed)
We have discussed taking over the counter medications for your pain.  Below are some common medicines to consider.  Also listed are the recommended doses and how to take these medications as a prescription strength medicine.  If you experience any issues or side effects, please discontinue the medicine and contact the office for some assistance.   Tylenol or acetaminophen - can be used to help treat minor pains and fevers or chills.  Common doses include 350 mg, 500 mg and 650 mg.  Following surgery, I recommend taking 1000 mg of this medication three times a day.  Some narcotic pain medications contain acetaminophen, so you cannot take additional acetaminophen. This medicine can be bad for your liver.  DO NOT TAKE MORE THAN 4000 mg per day.  Advil, Motrin or Ibuprofen - anti-inflammatory medicines.  Can be used to treat chronic pain and help with swelling.  Common over the counter dose includes 200 mg.  Bottle states you can take 1-2 pills.  Common prescription doses include 600 mg or 800 mg pills.  This medication can be hard on your stomach and your kidneys.  If you have a history of stomach ulcers or kidney issues, you should discuss this with your PCP.  This medicine can be taken 3 times per day.  You should take this medication with food in your stomach.   Aleve or Naproxen - anti inflammatory medicines.  Can be used to treat chronic pain and help with swelling. Common over the counter dose is 220 mg.  The bottle states you can take 1 pill, twice a day.   Typical prescription dose is 500 mg two times a day.  This medication can be hard on your stomach and your kidneys.  If you have a history of stomach ulcers or kidney issues, you should discuss this with your PCP.  This medicine can be taken 2 times per day.  You should take this medication with food in your stomach.    Acetaminopen and ibuprofen/naproxen can be taking at the same time, but please do not take ibuprofen and naproxen at the same time.   These medications work the same way and can cause further injury to your stomach and/or kidneys.    Patients taking a blood thinner are often encouraged not to take NSAIDs or medications like ibuprofen or naproxen because they increase the possibility of internal bleeding.  If you have any questions, you should contact the doctor who recommended these medicines.       Knee Exercises  Ask your health care provider which exercises are safe for you. Do exercises exactly as told by your health care provider and adjust them as directed. It is normal to feel mild stretching, pulling, tightness, or discomfort as you do these exercises. Stop right away if you feel sudden pain or your pain gets worse. Do not begin these exercises until told by your health care provider.  Stretching and range-of-motion exercises These exercises warm up your muscles and joints and improve the movement and flexibility of your knee. These exercises also help to relieve pain and swelling.  Knee extension, prone Lie on your abdomen (prone position) on a bed. Place your left / right knee just beyond the edge of the surface so your knee is not on the bed. You can put a towel under your left / right thigh just above your kneecap for comfort. Relax your leg muscles and allow gravity to straighten your knee (extension). You should feel a stretch behind  your left / right knee. Hold this position for 10 seconds. Scoot up so your knee is supported between repetitions. Repeat 10 times. Complete this exercise 3-4 times per week.     Knee flexion, active Lie on your back with both legs straight. If this causes back discomfort, bend your left / right knee so your foot is flat on the floor. Slowly slide your left / right heel back toward your buttocks. Stop when you feel a gentle stretch in the front of your knee or thigh (flexion). Hold this position for 10 seconds. Slowly slide your left / right heel back to the starting  position. Repeat 10 times. Complete this exercise 3-4 times per week.      Quadriceps stretch, prone Lie on your abdomen on a firm surface, such as a bed or padded floor. Bend your left / right knee and hold your ankle. If you cannot reach your ankle or pant leg, loop a belt around your foot and grab the belt instead. Gently pull your heel toward your buttocks. Your knee should not slide out to the side. You should feel a stretch in the front of your thigh and knee (quadriceps). Hold this position for 10 seconds. Repeat 10 times. Complete this exercise 3-4 times per week.      Hamstring, supine Lie on your back (supine position). Loop a belt or towel over the ball of your left / right foot. The ball of your foot is on the walking surface, right under your toes. Straighten your left / right knee and slowly pull on the belt to raise your leg until you feel a gentle stretch behind your knee (hamstring). Do not let your knee bend while you do this. Keep your other leg flat on the floor. Hold this position for 10 seconds. Repeat 10 times. Complete this exercise 3-4 times per week.   Strengthening exercises These exercises build strength and endurance in your knee. Endurance is the ability to use your muscles for a long time, even after they get tired.  Quadriceps, isometric This exercise stretches the muscles in front of your thigh (quadriceps) without moving your knee joint (isometric). Lie on your back with your left / right leg extended and your other knee bent. Put a rolled towel or small pillow under your knee if told by your health care provider. Slowly tense the muscles in the front of your left / right thigh. You should see your kneecap slide up toward your hip or see increased dimpling just above the knee. This motion will push the back of the knee toward the floor. For 10 seconds, hold the muscle as tight as you can without increasing your pain. Relax the muscles slowly and  completely. Repeat 10 times. Complete this exercise 3-4 times per week. .     Straight leg raises This exercise stretches the muscles in front of your thigh (quadriceps) and the muscles that move your hips (hip flexors). Lie on your back with your left / right leg extended and your other knee bent. Tense the muscles in the front of your left / right thigh. You should see your kneecap slide up or see increased dimpling just above the knee. Your thigh may even shake a bit. Keep these muscles tight as you raise your leg 4-6 inches (10-15 cm) off the floor. Do not let your knee bend. Hold this position for 10 seconds. Keep these muscles tense as you lower your leg. Relax your muscles slowly and completely after  each repetition. Repeat 10 times. Complete this exercise 3-4 times per week.  Hamstring, isometric Lie on your back on a firm surface. Bend your left / right knee about 30 degrees. Dig your left / right heel into the surface as if you are trying to pull it toward your buttocks. Tighten the muscles in the back of your thighs (hamstring) to "dig" as hard as you can without increasing any pain. Hold this position for 10 seconds. Release the tension gradually and allow your muscles to relax completely for __________ seconds after each repetition. Repeat 10 times. Complete this exercise 3-4 times per week.  Hamstring curls If told by your health care provider, do this exercise while wearing ankle weights. Begin with 5 lb weights. Then increase the weight by 1 lb (0.5 kg) increments. You can also use an exercise band Lie on your abdomen with your legs straight. Bend your left / right knee as far as you can without feeling pain. Keep your hips flat against the floor. Hold this position for 10 seconds. Slowly lower your leg to the starting position. Repeat 10 times. Complete this exercise 3-4 times per week.      Squats This exercise strengthens the muscles in front of your thigh and knee  (quadriceps). Stand in front of a table, with your feet and knees pointing straight ahead. You may rest your hands on the table for balance but not for support. Slowly bend your knees and lower your hips like you are going to sit in a chair. Keep your weight over your heels, not over your toes. Keep your lower legs upright so they are parallel with the table legs. Do not let your hips go lower than your knees. Do not bend lower than told by your health care provider. If your knee pain increases, do not bend as low. Hold the squat position for 10 seconds. Slowly push with your legs to return to standing. Do not use your hands to pull yourself to standing. Repeat 10 times. Complete this exercise 3-4 times per week .     Wall slides This exercise strengthens the muscles in front of your thigh and knee (quadriceps). Lean your back against a smooth wall or door, and walk your feet out 18-24 inches (46-61 cm) from it. Place your feet hip-width apart. Slowly slide down the wall or door until your knees bend 90 degrees. Keep your knees over your heels, not over your toes. Keep your knees in line with your hips. Hold this position for 10 seconds. Repeat 10 times. Complete this exercise 3-4 times per week.      Straight leg raises This exercise strengthens the muscles that rotate the leg at the hip and move it away from your body (hip abductors). Lie on your side with your left / right leg in the top position. Lie so your head, shoulder, knee, and hip line up. You may bend your bottom knee to help you keep your balance. Roll your hips slightly forward so your hips are stacked directly over each other and your left / right knee is facing forward. Leading with your heel, lift your top leg 4-6 inches (10-15 cm). You should feel the muscles in your outer hip lifting. Do not let your foot drift forward. Do not let your knee roll toward the ceiling. Hold this position for 10 seconds. Slowly return your  leg to the starting position. Let your muscles relax completely after each repetition. Repeat 10 times. Complete this exercise  3-4 times per week.      Straight leg raises This exercise stretches the muscles that move your hips away from the front of the pelvis (hip extensors). Lie on your abdomen on a firm surface. You can put a pillow under your hips if that is more comfortable. Tense the muscles in your buttocks and lift your left / right leg about 4-6 inches (10-15 cm). Keep your knee straight as you lift your leg. Hold this position for 10 seconds. Slowly lower your leg to the starting position. Let your leg relax completely after each repetition. Repeat 10 times. Complete this exercise 3-4 times per week.

## 2023-04-01 NOTE — Progress Notes (Signed)
New Patient Visit  Assessment: Javier Lee is a 73 y.o. male with the following: 1. Chronic pain of right knee  Plan: Javier Lee has pain in his left knee.  No specific injury.  He notes some swelling.  Radiographs demonstrates some medial joint space narrowing, with small osteophytes within the patellofemoral joint.  Onset and description of the pain consistent with mild arthritis.  On exam, his knee is stable.  Mild tenderness to palpation along the medial joint line.  We discussed multiple treatment options for his pain.  This is largely symptomatic.  Consider medicines like Tylenol or ibuprofen.  Can use a brace.  If his knee continues to bother him, we can consider an injection.  He states his understanding.  He will follow-up as needed.  Follow-up: Return if symptoms worsen or fail to improve.  Subjective:  Chief Complaint  Patient presents with   Knee Pain    Pain left knee and stiffness x6 months ago no injury     History of Present Illness: Javier Lee is a 73 y.o. male who presents for evaluation of left knee pain.  He states has had pain in the left knee for about 6 months.  According to his wife, they like to go to the beach and use a camper.  The pain likely started when they are sitting at their camper for their visit, which requires a lot of bending and kneeling.  He started to feel the pain and stiffness as he was driving home from the beach, roughly a 5-hour drive.  He feels like he has some swelling in his knee.  He will take medicines occasionally.  He has tried a brace.  No injections.  No prior injuries to his left knee.  If he is sedentary, he notes that the knee gets stiff.   Review of Systems: No fevers or chills No numbness or tingling No chest pain No shortness of breath No bowel or bladder dysfunction No GI distress No headaches   Medical History:  Past Medical History:  Diagnosis Date   GERD (gastroesophageal reflux disease)     Hypertension     Past Surgical History:  Procedure Laterality Date   COLONOSCOPY      No family history on file. Social History   Tobacco Use   Smoking status: Never   Smokeless tobacco: Never  Substance Use Topics   Alcohol use: Yes   Drug use: Never    Allergies  Allergen Reactions   Tricyclic Antidepressants     Current Meds  Medication Sig   Ascorbic Acid (VITAMIN C) 1000 MG tablet Take 1,000 mg by mouth daily.   metoprolol tartrate (LOPRESSOR) 50 MG tablet Take 50 mg by mouth 2 (two) times daily.   olmesartan-hydrochlorothiazide (BENICAR HCT) 20-12.5 MG tablet Take 1 tablet by mouth daily.   omeprazole (PRILOSEC) 20 MG capsule Take 20 mg by mouth daily.   simvastatin (ZOCOR) 20 MG tablet Take 20 mg by mouth daily.    Objective: BP 121/81   Pulse 60   Ht 5\' 8"  (1.727 m)   Wt 181 lb (82.1 kg)   BMI 27.52 kg/m   Physical Exam:  General: Alert and oriented. and No acute distress. Gait: Left sided antalgic gait.  Evaluation of the left knee demonstrates no redness.  No bruising.  Mild effusion.  Crepitus with range of motion testing.  Knee is stable to varus or valgus stress.  Negative Lachman.  Tenderness to palpation along the  medial joint line.  Mild pain in the medial knee with hyperflexion.  IMAGING: I personally ordered and reviewed the following images   X-rays of the left knee were obtained in clinic today.  No acute injuries are noted.  Mild to moderate loss of joint space within the medial compartment.  Minimal osteophytes are appreciated.  Maintained joint space within the patellofemoral compartment, with some osteophytes of the medial lateral facets of the patella.  No bony lesions.  Impression: Left knee x-rays with mild to moderate loss of joint space within the medial compartment   New Medications:  No orders of the defined types were placed in this encounter.     Oliver Barre, MD  04/01/2023 9:44 AM

## 2023-04-22 DIAGNOSIS — H43393 Other vitreous opacities, bilateral: Secondary | ICD-10-CM | POA: Diagnosis not present

## 2023-05-20 DIAGNOSIS — H35373 Puckering of macula, bilateral: Secondary | ICD-10-CM | POA: Diagnosis not present

## 2023-06-16 ENCOUNTER — Telehealth: Payer: Self-pay

## 2023-06-16 NOTE — Telephone Encounter (Signed)
 Patients wife called stating that she wanted to schedule patient for a colonoscopy in July. She states patient is due for his 5 year colonoscopy. Patient had last colonoscopy 5 years ago in [place] and is requesting to seen with Dr. General Kenner. Advised wife we needed records prior to scheduling, she will have records sent over for Dr. General Kenner to review.

## 2023-06-18 ENCOUNTER — Telehealth: Payer: Self-pay | Admitting: Gastroenterology

## 2023-06-18 NOTE — Telephone Encounter (Addendum)
 Good afternoon Dr. General Kenner,   We received a call from this patient's wife, wishing to establish GI care with you for a colonoscopy. Patient last had a colonoscopy in July of 2020 in [place]. Since then, they have moved to [place], and patient is due for his five year recall. Records were obtained and scanned into [employer] for you to review. Would you please advise on scheduling?  Thank you.

## 2023-06-26 ENCOUNTER — Encounter: Payer: Self-pay | Admitting: Gastroenterology

## 2023-08-04 DIAGNOSIS — D559 Anemia due to enzyme disorder, unspecified: Secondary | ICD-10-CM | POA: Diagnosis not present

## 2023-08-04 DIAGNOSIS — E1165 Type 2 diabetes mellitus with hyperglycemia: Secondary | ICD-10-CM | POA: Diagnosis not present

## 2023-08-04 DIAGNOSIS — E7849 Other hyperlipidemia: Secondary | ICD-10-CM | POA: Diagnosis not present

## 2023-08-04 DIAGNOSIS — Z13220 Encounter for screening for lipoid disorders: Secondary | ICD-10-CM | POA: Diagnosis not present

## 2023-08-04 DIAGNOSIS — E039 Hypothyroidism, unspecified: Secondary | ICD-10-CM | POA: Diagnosis not present

## 2023-08-04 DIAGNOSIS — Z1321 Encounter for screening for nutritional disorder: Secondary | ICD-10-CM | POA: Diagnosis not present

## 2023-08-11 DIAGNOSIS — E7849 Other hyperlipidemia: Secondary | ICD-10-CM | POA: Diagnosis not present

## 2023-08-11 DIAGNOSIS — Z1331 Encounter for screening for depression: Secondary | ICD-10-CM | POA: Diagnosis not present

## 2023-08-11 DIAGNOSIS — Z6827 Body mass index (BMI) 27.0-27.9, adult: Secondary | ICD-10-CM | POA: Diagnosis not present

## 2023-08-11 DIAGNOSIS — I10 Essential (primary) hypertension: Secondary | ICD-10-CM | POA: Diagnosis not present

## 2023-08-11 DIAGNOSIS — Z1389 Encounter for screening for other disorder: Secondary | ICD-10-CM | POA: Diagnosis not present

## 2023-08-11 DIAGNOSIS — G4700 Insomnia, unspecified: Secondary | ICD-10-CM | POA: Diagnosis not present

## 2023-08-11 DIAGNOSIS — K219 Gastro-esophageal reflux disease without esophagitis: Secondary | ICD-10-CM | POA: Diagnosis not present

## 2023-08-11 DIAGNOSIS — Z0001 Encounter for general adult medical examination with abnormal findings: Secondary | ICD-10-CM | POA: Diagnosis not present

## 2023-08-18 ENCOUNTER — Ambulatory Visit (AMBULATORY_SURGERY_CENTER)

## 2023-08-18 VITALS — Ht 68.0 in | Wt 182.8 lb

## 2023-08-18 DIAGNOSIS — Z860100 Personal history of colon polyps, unspecified: Secondary | ICD-10-CM

## 2023-08-18 MED ORDER — NA SULFATE-K SULFATE-MG SULF 17.5-3.13-1.6 GM/177ML PO SOLN: 1.0000 | Freq: Once | ORAL | 0 refills | Status: AC

## 2023-08-18 NOTE — Progress Notes (Signed)
No egg or soy allergy known to patient  No issues known to pt with past sedation with any surgeries or procedures Patient denies ever being told they had issues or difficulty with intubation  No FH of Malignant Hyperthermia Pt is not on diet pills Pt is not on home 02  Pt is not on blood thinners  Pt denies issues with chronic constipation  No A fib or A flutter Have any cardiac testing pending--no Pt instructed to use [email or web address] or GoodRx for a price reduction on prep  Ambulates independently

## 2023-08-21 ENCOUNTER — Telehealth: Payer: Self-pay | Admitting: Gastroenterology

## 2023-08-21 ENCOUNTER — Encounter: Payer: Self-pay | Admitting: Gastroenterology

## 2023-08-21 NOTE — Telephone Encounter (Signed)
 PT is calling to confirm that the medication list that was discussed during the pre visit is absolutely wrong and that he would like to further discuss this information. Please advise.

## 2023-08-21 NOTE — Telephone Encounter (Signed)
 Called and spoke with patient's wife, Antoine Bathe, [place] verified; Vicki reports medications were not right on the medication list; medications changed per patient's wife as requested; clarification on start time for prep on the evening before surgery also reviewed with Antoine Bathe; additional information reviewed with Antoine Bathe and questions answered;  Antoine Bathe advised to call back to the office at [phone] should questions/concerns arise; Antoine Bathe verbalized understanding of information/instructions;

## 2023-09-04 DIAGNOSIS — J387 Other diseases of larynx: Secondary | ICD-10-CM | POA: Diagnosis not present

## 2023-09-04 DIAGNOSIS — K219 Gastro-esophageal reflux disease without esophagitis: Secondary | ICD-10-CM | POA: Diagnosis not present

## 2023-09-10 ENCOUNTER — Encounter: Payer: Self-pay | Admitting: Gastroenterology

## 2023-09-10 ENCOUNTER — Ambulatory Visit: Admitting: Gastroenterology

## 2023-09-10 VITALS — BP 96/66 | HR 53 | Temp 97.8°F | Resp 12 | Ht 68.0 in | Wt 182.0 lb

## 2023-09-10 DIAGNOSIS — K635 Polyp of colon: Secondary | ICD-10-CM | POA: Diagnosis not present

## 2023-09-10 DIAGNOSIS — D123 Benign neoplasm of transverse colon: Secondary | ICD-10-CM

## 2023-09-10 DIAGNOSIS — K648 Other hemorrhoids: Secondary | ICD-10-CM | POA: Diagnosis not present

## 2023-09-10 DIAGNOSIS — K6389 Other specified diseases of intestine: Secondary | ICD-10-CM

## 2023-09-10 DIAGNOSIS — Z1211 Encounter for screening for malignant neoplasm of colon: Secondary | ICD-10-CM

## 2023-09-10 DIAGNOSIS — K5730 Diverticulosis of large intestine without perforation or abscess without bleeding: Secondary | ICD-10-CM

## 2023-09-10 DIAGNOSIS — Z860100 Personal history of colon polyps, unspecified: Secondary | ICD-10-CM

## 2023-09-10 DIAGNOSIS — Z860101 Personal history of adenomatous and serrated colon polyps: Secondary | ICD-10-CM | POA: Diagnosis not present

## 2023-09-10 MED ORDER — SODIUM CHLORIDE 0.9 % IV SOLN: 500.0000 mL | Freq: Once | INTRAVENOUS | Status: DC

## 2023-09-10 NOTE — Progress Notes (Signed)
 To pacu, VSS. Report to [email or web address]

## 2023-09-10 NOTE — Patient Instructions (Signed)
 Handouts provided on polyps and diverticulosis.  Resume previous diet and medications.  Biopsy results will be sent via MyChart or letter.  YOU HAD AN ENDOSCOPIC PROCEDURE TODAY AT THE [facility]:   Refer to the procedure report that was given to you for any specific questions about what was found during the examination.  If the procedure report does not answer your questions, please call your gastroenterologist to clarify.  If you requested that your care partner not be given the details of your procedure findings, then the procedure report has been included in a sealed envelope for you to review at your convenience later.  YOU SHOULD EXPECT: Some feelings of bloating in the abdomen. Passage of more gas than usual.  Walking can help get rid of the air that was put into your GI tract during the procedure and reduce the bloating. If you had a lower endoscopy (such as a colonoscopy or flexible sigmoidoscopy) you may notice spotting of blood in your stool or on the toilet paper. If you underwent a bowel prep for your procedure, you may not have a normal bowel movement for a few days.  Please Note:  You might notice some irritation and congestion in your nose or some drainage.  This is from the oxygen used during your procedure.  There is no need for concern and it should clear up in a day or so.  SYMPTOMS TO REPORT IMMEDIATELY:  Following lower endoscopy (colonoscopy or flexible sigmoidoscopy):  Excessive amounts of blood in the stool  Significant tenderness or worsening of abdominal pains  Swelling of the abdomen that is new, acute  Fever of 100F or higher  For urgent or emergent issues, a gastroenterologist can be reached at any hour by calling [phone]. Do not use MyChart messaging for urgent concerns.    DIET:  We do recommend a small meal at first, but then you may proceed to your regular diet.  Drink plenty of fluids but you should avoid alcoholic beverages for 24  hours.  ACTIVITY:  You should plan to take it easy for the rest of today and you should NOT DRIVE or use heavy machinery until tomorrow (because of the sedation medicines used during the test).    FOLLOW UP: Our staff will call the number listed on your records the next business day following your procedure.  We will call around 7:15- 8:00 am to check on you and address any questions or concerns that you may have regarding the information given to you following your procedure. If we do not reach you, we will leave a message.     If any biopsies were taken you will be contacted by phone or by letter within the next 1-3 weeks.  Please call us  at [phone] if you have not heard about the biopsies in 3 weeks.    SIGNATURES/CONFIDENTIALITY: You and/or your care partner have signed paperwork which will be entered into your electronic medical record.  These signatures attest to the fact that that the information above on your After Visit Summary has been reviewed and is understood.  Full responsibility of the confidentiality of this discharge information lies with you and/or your care-partner.

## 2023-09-10 NOTE — Progress Notes (Signed)
 Called to room to assist during endoscopic procedure.  Patient ID and intended procedure confirmed with present staff. Received instructions for my participation in the procedure from the performing physician.

## 2023-09-10 NOTE — Progress Notes (Signed)
 Pt's states no medical or surgical changes since previsit or office visit.

## 2023-09-10 NOTE — Op Note (Signed)
 [facility] Patient Name: Bernhard Koskinen Procedure Date: 09/10/2023 7:25 AM MRN: [id number] Endoscopist: Elspeth P. Leigh , MD, [id number] Age: 73 Referring MD:  Date of Birth: Jul 21, 1950 Gender: Male Account #: [id number] Procedure:                Colonoscopy Indications:              High risk colon cancer surveillance: Personal                            history of colonic polyps - last exam 09/2018 -                            adenoma Medicines:                Monitored Anesthesia Care Procedure:                Pre-Anesthesia Assessment:                           - Prior to the procedure, a History and Physical                            was performed, and patient medications and                            allergies were reviewed. The patient's tolerance of                            previous anesthesia was also reviewed. The risks                            and benefits of the procedure and the sedation                            options and risks were discussed with the patient.                            All questions were answered, and informed consent                            was obtained. Prior Anticoagulants: The patient has                            taken no anticoagulant or antiplatelet agents. ASA                            Grade Assessment: II - A patient with mild systemic                            disease. After reviewing the risks and benefits,                            the patient was deemed in satisfactory condition to  undergo the procedure.                           After obtaining informed consent, the colonoscope                            was passed under direct vision. Throughout the                            procedure, the patient's blood pressure, pulse, and                            oxygen saturations were monitored continuously. The                            Olympus Scope SN [id number] was introduced through  the                            anus and advanced to the the cecum, identified by                            appendiceal orifice and ileocecal valve. The                            colonoscopy was performed without difficulty. The                            patient tolerated the procedure well. The quality                            of the bowel preparation was adequate. The                            ileocecal valve, appendiceal orifice, and rectum                            were photographed. Scope In: 7:50:37 AM Scope Out: 8:05:51 AM Scope Withdrawal Time: 0 hours 12 minutes 4 seconds  Total Procedure Duration: 0 hours 15 minutes 14 seconds  Findings:                 The perianal and digital rectal examinations were                            normal.                           A 3 mm polyp was found in the transverse colon. The                            polyp was sessile. The polyp was removed with a                            cold snare. Resection and retrieval were complete.  Multiple small-mouthed diverticula were found in                            the sigmoid colon.                           Internal hemorrhoids were found during retroflexion.                           The exam was otherwise without abnormality. Complications:            No immediate complications. Estimated blood loss:                            Minimal. Estimated Blood Loss:     Estimated blood loss was minimal. Impression:               - One 3 mm polyp in the transverse colon, removed                            with a cold snare. Resected and retrieved.                           - Diverticulosis in the sigmoid colon.                           - Internal hemorrhoids.                           - The examination was otherwise normal. Recommendation:           - Patient has a contact number available for                            emergencies. The signs and symptoms of potential                             delayed complications were discussed with the                            patient. Return to normal activities tomorrow.                            Written discharge instructions were provided to the                            patient.                           - Resume previous diet.                           - Continue present medications.                           - Await pathology results. Elspeth P. Retia Cordle, MD 09/10/2023 8:11:51 AM This report has been signed electronically.

## 2023-09-10 NOTE — Progress Notes (Signed)
 [facility] History and Physical   Primary Care Physician:  [facility]   Reason for Procedure:   History of colon polyps  Plan:    colonoscopy     HPI: Javier Lee is a 73 y.o. male  here for colonoscopy surveillance - adenoma removed 09/2018 in [place], told to repeat in 5 years.   Patient denies any bowel symptoms at this time. No family history of colon cancer known. Otherwise feels well without any cardiopulmonary symptoms.   I have discussed risks / benefits of anesthesia and endoscopic procedure with Javier Lee and they wish to proceed with the exams as outlined today.    Past Medical History:  Diagnosis Date   GERD (gastroesophageal reflux disease)    Hypertension     Past Surgical History:  Procedure Laterality Date   CATARACT EXTRACTION  2016   COLONOSCOPY  2020   EXCISIONAL HEMORRHOIDECTOMY  1990   Vasectomy  1985    Prior to Admission medications   Medication Sig Start Date End Date Taking? Authorizing Provider  Ascorbic Acid (VITAMIN C) 1000 MG tablet Take 1,000 mg by mouth 2 (two) times a week.   Yes [provider]  calcium carbonate (TUMS - DOSED IN MG ELEMENTAL CALCIUM) 500 MG chewable tablet Chew 1 tablet by mouth daily as needed for indigestion or heartburn.   Yes [provider]  Carboxymethylcellulose Sod PF (THERATEARS PF) 0.25 % SOLN Apply 1-2 drops to eye daily as needed.   Yes [provider]  LORazepam (ATIVAN) 1 MG tablet Take 1 mg by mouth daily as needed. 07/19/23  Yes [provider]  metoprolol succinate (TOPROL-XL) 50 MG 24 hr tablet Take 25 mg by mouth daily. Take with or immediately following a meal.   Yes [provider]  olmesartan-hydrochlorothiazide (BENICAR HCT) 20-12.5 MG tablet Take 1 tablet by mouth daily.   Yes [provider]  simvastatin (ZOCOR) 20 MG tablet Take 20 mg by mouth daily.   Yes [provider]  omeprazole (PRILOSEC) 20  MG capsule Take 20 mg by mouth daily as needed (indigestion/heart burn).    [provider]    Current Outpatient Medications  Medication Sig Dispense Refill   Ascorbic Acid (VITAMIN C) 1000 MG tablet Take 1,000 mg by mouth 2 (two) times a week.     calcium carbonate (TUMS - DOSED IN MG ELEMENTAL CALCIUM) 500 MG chewable tablet Chew 1 tablet by mouth daily as needed for indigestion or heartburn.     Carboxymethylcellulose Sod PF (THERATEARS PF) 0.25 % SOLN Apply 1-2 drops to eye daily as needed.     LORazepam (ATIVAN) 1 MG tablet Take 1 mg by mouth daily as needed.     metoprolol succinate (TOPROL-XL) 50 MG 24 hr tablet Take 25 mg by mouth daily. Take with or immediately following a meal.     olmesartan-hydrochlorothiazide (BENICAR HCT) 20-12.5 MG tablet Take 1 tablet by mouth daily.     simvastatin (ZOCOR) 20 MG tablet Take 20 mg by mouth daily.     omeprazole (PRILOSEC) 20 MG capsule Take 20 mg by mouth daily as needed (indigestion/heart burn).     Current Facility-Administered Medications  Medication Dose Route Frequency Provider Last Rate Last Admin   0.9 %  sodium chloride  infusion  500 mL Intravenous Once Tayvia Faughnan, Elspeth SQUIBB, MD        Allergies as of 09/10/2023 - Review Complete 09/10/2023  Allergen Reaction Noted   Tricyclic antidepressants Other (See  Comments) 07/03/2020    Family History  Problem Relation Age of Onset   Colon cancer Neg Hx    Esophageal cancer Neg Hx    Rectal cancer Neg Hx    Stomach cancer Neg Hx    Colon polyps Neg Hx     Social History   Socioeconomic History   Marital status: Married    Spouse name: Not on file   Number of children: Not on file   Years of education: Not on file   Highest education level: Not on file  Occupational History   Not on file  Tobacco Use   Smoking status: Former    Types: Cigarettes   Smokeless tobacco: Never  Vaping Use   Vaping status: Never Used  Substance and Sexual Activity   Alcohol  use: Yes     Alcohol /week: 4.0 standard drinks of alcohol     Types: 4 Glasses of wine per week   Drug use: Never   Sexual activity: Not on file  Other Topics Concern   Not on file  Social History Narrative   Not on file   Social Drivers of Health   Financial Resource Strain: Not on file  Food Insecurity: Not on file  Transportation Needs: Not on file  Physical Activity: Not on file  Stress: Not on file  Social Connections: Not on file  Intimate Partner Violence: Not on file    Review of Systems: All other review of systems negative except as mentioned in the HPI.  Physical Exam: Vital signs BP (!) 146/79   Pulse (!) 56   Temp 97.8 F (36.6 C)   Resp 10   Ht 5' 8 (1.727 m)   Wt 182 lb (82.6 kg)   SpO2 99%   BMI 27.67 kg/m   General:   Alert,  Well-developed, pleasant and cooperative in NAD Lungs:  Clear throughout to auscultation.   Heart:  Regular rate and rhythm Abdomen:  Soft, nontender and nondistended.   Neuro/Psych:  Alert and cooperative. Normal mood and affect. A and O x 3  Marcey Naval, MD [facility]

## 2023-09-11 ENCOUNTER — Telehealth: Payer: Self-pay | Admitting: *Deleted

## 2023-09-11 NOTE — Telephone Encounter (Signed)
  Follow up Call-     09/10/2023    7:13 AM  Call back number  Post procedure Call Back phone  # [phone]  Permission to leave phone message Yes     Patient questions:  Do you have a fever, pain , or abdominal swelling? No. Pain Score  0 *  Have you tolerated food without any problems? Yes.    Have you been able to return to your normal activities? Yes.    Do you have any questions about your discharge instructions: Diet   No. Medications  No. Follow up visit  No.  Do you have questions or concerns about your Care? No.  Actions: * If pain score is 4 or above: No action needed, pain <4.

## 2023-09-12 ENCOUNTER — Encounter (HOSPITAL_BASED_OUTPATIENT_CLINIC_OR_DEPARTMENT_OTHER): Payer: Self-pay | Admitting: Otolaryngology

## 2023-09-12 LAB — SURGICAL PATHOLOGY

## 2023-09-13 ENCOUNTER — Ambulatory Visit: Payer: Self-pay | Admitting: Gastroenterology

## 2023-09-15 ENCOUNTER — Other Ambulatory Visit: Payer: Self-pay

## 2023-09-15 ENCOUNTER — Encounter (HOSPITAL_BASED_OUTPATIENT_CLINIC_OR_DEPARTMENT_OTHER): Payer: Self-pay | Admitting: Otolaryngology

## 2023-09-15 ENCOUNTER — Ambulatory Visit (HOSPITAL_BASED_OUTPATIENT_CLINIC_OR_DEPARTMENT_OTHER): Admitting: Anesthesiology

## 2023-09-15 ENCOUNTER — Encounter (HOSPITAL_BASED_OUTPATIENT_CLINIC_OR_DEPARTMENT_OTHER)
Admission: RE | Disposition: A | Discharge status: Home / Self Care | Payer: Self-pay | Source: Home / Self Care | Attending: Otolaryngology

## 2023-09-15 ENCOUNTER — Ambulatory Visit (HOSPITAL_BASED_OUTPATIENT_CLINIC_OR_DEPARTMENT_OTHER)
Admission: RE | Admit: 2023-09-15 | Discharge: 2023-09-15 | Disposition: A | Discharge status: Home / Self Care | Attending: Otolaryngology | Admitting: Otolaryngology

## 2023-09-15 DIAGNOSIS — Z01818 Encounter for other preprocedural examination: Secondary | ICD-10-CM

## 2023-09-15 DIAGNOSIS — J383 Other diseases of vocal cords: Secondary | ICD-10-CM

## 2023-09-15 DIAGNOSIS — I10 Essential (primary) hypertension: Secondary | ICD-10-CM | POA: Insufficient documentation

## 2023-09-15 DIAGNOSIS — K219 Gastro-esophageal reflux disease without esophagitis: Secondary | ICD-10-CM | POA: Insufficient documentation

## 2023-09-15 DIAGNOSIS — R490 Dysphonia: Secondary | ICD-10-CM | POA: Insufficient documentation

## 2023-09-15 DIAGNOSIS — J387 Other diseases of larynx: Secondary | ICD-10-CM | POA: Diagnosis not present

## 2023-09-15 DIAGNOSIS — C320 Malignant neoplasm of glottis: Secondary | ICD-10-CM | POA: Diagnosis not present

## 2023-09-15 DIAGNOSIS — Z87891 Personal history of nicotine dependence: Secondary | ICD-10-CM | POA: Diagnosis not present

## 2023-09-15 LAB — BASIC METABOLIC PANEL WITH GFR
Anion gap: 10 (ref 5–15)
BUN: 16 mg/dL (ref 8–23)
CO2: 25 mmol/L (ref 22–32)
Calcium: 9.3 mg/dL (ref 8.9–10.3)
Chloride: 100 mmol/L (ref 98–111)
Creatinine, Ser: 0.99 mg/dL (ref 0.61–1.24)
GFR, Estimated: >60 mL/min (ref >60)
Glucose, Bld: 88 mg/dL (ref 70–99)
Potassium: 4 mmol/L (ref 3.5–5.1)
Sodium: 135 mmol/L (ref 135–145)

## 2023-09-15 SURGERY — MICROLARYNGOSCOPY
Anesthesia: General | Site: Throat | Laterality: Left

## 2023-09-15 MED ORDER — PROPOFOL 500 MG/50ML IV EMUL
INTRAVENOUS | Status: AC
  Filled 2023-09-15: qty 50

## 2023-09-15 MED ORDER — LACTATED RINGERS IV SOLN: INTRAVENOUS | Status: DC

## 2023-09-15 MED ORDER — PROPOFOL 10 MG/ML IV BOLUS
INTRAVENOUS | Status: AC
Start: 2023-09-15 — End: 2023-09-15
  Filled 2023-09-15: qty 20

## 2023-09-15 MED ORDER — LIDOCAINE-EPINEPHRINE 1 %-1:100000 IJ SOLN
INTRAMUSCULAR | Status: DC | PRN
Start: 2023-09-15 — End: 2023-09-15
  Administered 2023-09-15: 1 mL

## 2023-09-15 MED ORDER — SUGAMMADEX SODIUM 200 MG/2ML IV SOLN
INTRAVENOUS | Status: DC | PRN
  Administered 2023-09-15: 160 mg via INTRAVENOUS

## 2023-09-15 MED ORDER — EPINEPHRINE PF 1 MG/ML IJ SOLN
INTRAMUSCULAR | Status: DC | PRN
  Administered 2023-09-15: 1 mg

## 2023-09-15 MED ORDER — OXYCODONE HCL 5 MG/5ML PO SOLN: 5.0000 mg | Freq: Once | ORAL | Status: DC | PRN

## 2023-09-15 MED ORDER — EPHEDRINE SULFATE (PRESSORS) 50 MG/ML IJ SOLN
INTRAMUSCULAR | Status: DC | PRN
  Administered 2023-09-15 (×2): 5 mg via INTRAVENOUS

## 2023-09-15 MED ORDER — FENTANYL CITRATE (PF) 100 MCG/2ML IJ SOLN
INTRAMUSCULAR | Status: AC
  Filled 2023-09-15: qty 2

## 2023-09-15 MED ORDER — FENTANYL CITRATE (PF) 100 MCG/2ML IJ SOLN: 25.0000 ug | INTRAMUSCULAR | Status: DC | PRN

## 2023-09-15 MED ORDER — ACETAMINOPHEN 10 MG/ML IV SOLN: 1000.0000 mg | Freq: Once | INTRAVENOUS | Status: DC | PRN

## 2023-09-15 MED ORDER — DEXAMETHASONE SODIUM PHOSPHATE 4 MG/ML IJ SOLN
INTRAMUSCULAR | Status: DC | PRN
Start: 2023-09-15 — End: 2023-09-15
  Administered 2023-09-15: 5 mg via INTRAVENOUS

## 2023-09-15 MED ORDER — LIDOCAINE HCL (CARDIAC) PF 100 MG/5ML IV SOSY
PREFILLED_SYRINGE | INTRAVENOUS | Status: DC | PRN
  Administered 2023-09-15: 100 mg via INTRAVENOUS

## 2023-09-15 MED ORDER — ONDANSETRON HCL 4 MG/2ML IJ SOLN: 4.0000 mg | Freq: Once | INTRAMUSCULAR | Status: DC | PRN

## 2023-09-15 MED ORDER — PROPOFOL 10 MG/ML IV BOLUS
INTRAVENOUS | Status: DC | PRN
  Administered 2023-09-15: 100 mg via INTRAVENOUS

## 2023-09-15 MED ORDER — ONDANSETRON HCL 4 MG/2ML IJ SOLN
INTRAMUSCULAR | Status: DC | PRN
  Administered 2023-09-15: 4 mg via INTRAVENOUS

## 2023-09-15 MED ORDER — MIDAZOLAM HCL 2 MG/2ML IJ SOLN
INTRAMUSCULAR | Status: AC
  Filled 2023-09-15: qty 2

## 2023-09-15 MED ORDER — MIDAZOLAM HCL 2 MG/2ML IJ SOLN
INTRAMUSCULAR | Status: DC | PRN
  Administered 2023-09-15: 1 mg via INTRAVENOUS

## 2023-09-15 MED ORDER — DEXMEDETOMIDINE HCL IN NACL 80 MCG/20ML IV SOLN
INTRAVENOUS | Status: AC
Start: 2023-09-15 — End: 2023-09-15
  Filled 2023-09-15: qty 20

## 2023-09-15 MED ORDER — EPINEPHRINE PF 1 MG/ML IJ SOLN
INTRAMUSCULAR | Status: AC
  Filled 2023-09-15: qty 2

## 2023-09-15 MED ORDER — ROCURONIUM BROMIDE 100 MG/10ML IV SOLN
INTRAVENOUS | Status: DC | PRN
  Administered 2023-09-15: 40 mg via INTRAVENOUS

## 2023-09-15 MED ORDER — OXYCODONE HCL 5 MG PO TABS: 5.0000 mg | ORAL_TABLET | Freq: Once | ORAL | Status: DC | PRN

## 2023-09-15 MED ORDER — FENTANYL CITRATE (PF) 100 MCG/2ML IJ SOLN
INTRAMUSCULAR | Status: DC | PRN
  Administered 2023-09-15 (×2): 50 ug via INTRAVENOUS

## 2023-09-15 SURGICAL SUPPLY — 24 items
CANISTER SUCT 1200ML W/VALVE (MISCELLANEOUS) ×2 IMPLANT
DEFOGGER MIRROR 1QT (MISCELLANEOUS) ×1 IMPLANT
GAUZE SPONGE 4X4 12PLY STRL LF (GAUZE/BANDAGES/DRESSINGS) ×4 IMPLANT
GLOVE ECLIPSE 7.5 STRL STRAW (GLOVE) ×2 IMPLANT
GLOVE SURG SS PI 6.5 STRL IVOR (GLOVE) ×1 IMPLANT
GOWN STRL REUS W/ TWL LRG LVL3 (GOWN DISPOSABLE) IMPLANT
GOWN STRL REUS W/ TWL XL LVL3 (GOWN DISPOSABLE) IMPLANT
GUARD TEETH (MISCELLANEOUS) ×1 IMPLANT
MARKER SKIN DUAL TIP RULER LAB (MISCELLANEOUS) IMPLANT
NDL HYPO 18GX1.5 BLUNT FILL (NEEDLE) ×1 IMPLANT
NDL SPNL 22GX7 QUINCKE BK (NEEDLE) IMPLANT
NEEDLE HYPO 18GX1.5 BLUNT FILL (NEEDLE) ×2 IMPLANT
NEEDLE SPNL 22GX7 QUINCKE BK (NEEDLE) ×2 IMPLANT
NS IRRIG 1000ML POUR BTL (IV SOLUTION) ×2 IMPLANT
PACK BASIN DAY SURGERY FS (CUSTOM PROCEDURE TRAY) ×2 IMPLANT
PATTIES SURGICAL .5 X3 (DISPOSABLE) ×2 IMPLANT
SHEET MEDIUM DRAPE 40X70 STRL (DRAPES) ×2 IMPLANT
SLEEVE SCD COMPRESS KNEE MED (STOCKING) ×1 IMPLANT
SURGILUBE 2OZ TUBE FLIPTOP (MISCELLANEOUS) IMPLANT
SYR 5ML LL (SYRINGE) ×1 IMPLANT
SYR CONTROL 10ML LL (SYRINGE) ×1 IMPLANT
SYR TB 1ML LL NO SAFETY (SYRINGE) ×1 IMPLANT
TOWEL GREEN STERILE FF (TOWEL DISPOSABLE) ×2 IMPLANT
TUBE CONNECTING 20X1/4 (TUBING) ×4 IMPLANT

## 2023-09-15 NOTE — Discharge Instructions (Addendum)
 Avoid vocal straining, whispering, screaming, as much as possible.  Soft diet may be more comfortable for a few days. Use ice pack on throat if it helps with pain. Cool liquids may be soothing.    Post Anesthesia Home Care Instructions  Activity: Get plenty of rest for the remainder of the day. A responsible individual must stay with you for 24 hours following the procedure.  For the next 24 hours, DO NOT: -Drive a car -Advertising copywriter -Drink alcoholic beverages -Take any medication unless instructed by your physician -Make any legal decisions or sign important papers.  Meals: Start with liquid foods such as gelatin or soup. Progress to regular foods as tolerated. Avoid greasy, spicy, heavy foods. If nausea and/or vomiting occur, drink only clear liquids until the nausea and/or vomiting subsides. Call your physician if vomiting continues.  Special Instructions/Symptoms: Your throat may feel dry or sore from the anesthesia or the breathing tube placed in your throat during surgery. If this causes discomfort, gargle with warm salt water. The discomfort should disappear within 24 hours.  If you had a scopolamine patch placed behind your ear for the management of post- operative nausea and/or vomiting:  1. The medication in the patch is effective for 72 hours, after which it should be removed.  Wrap patch in a tissue and discard in the trash. Wash hands thoroughly with soap and water. 2. You may remove the patch earlier than 72 hours if you experience unpleasant side effects which may include dry mouth, dizziness or visual disturbances. 3. Avoid touching the patch. Wash your hands with soap and water after contact with the patch.

## 2023-09-15 NOTE — Anesthesia Postprocedure Evaluation (Signed)
 Anesthesia Post Note  Patient: Javier Lee  Procedure(s) Performed: Microlaryngoscopy with excision vocal cord lesion (Left: Throat)     Patient location during evaluation: PACU Anesthesia Type: General Level of consciousness: awake and alert Pain management: pain level controlled Vital Signs Assessment: post-procedure vital signs reviewed and stable Respiratory status: spontaneous breathing, nonlabored ventilation, respiratory function stable and patient connected to nasal cannula oxygen Cardiovascular status: blood pressure returned to baseline and stable Postop Assessment: no apparent nausea or vomiting Anesthetic complications: no   No notable events documented.  Last Vitals:  Vitals:   09/15/23 1408 09/15/23 1420  BP:  (!) 158/68  Pulse: (!) 55 69  Resp: 10 16  Temp:  (!) 36.1 C  SpO2: 94% 95%    Last Pain:  Vitals:   09/15/23 1420  TempSrc:   PainSc: 4                  Lynwood MARLA Cornea

## 2023-09-15 NOTE — Op Note (Signed)
 OPERATIVE REPORT  DATE OF SURGERY: 09/15/2023  PATIENT:  Javier Lee,  73 y.o. male  PRE-OPERATIVE DIAGNOSIS:  Laryngeal mass, Laryngopharyngeal reflux, LPR  POST-OPERATIVE DIAGNOSIS:  Vocal cord mass, left  PROCEDURE:  Procedure(s): Microlaryngoscopy with excision vocal cord lesion, left  SURGEON:  Ida VEAR Loader, MD  ASSISTANTS: none  ANESTHESIA:   General   EBL: 10 ml  DRAINS: none  LOCAL MEDICATIONS USED: 1% Xylocaine  with epinephrine   SPECIMEN: Left vocal cord lesion for pathologic evaluation  COUNTS:  Correct  PROCEDURE DETAILS: The patient was taken to the operating room and placed on the operating table in the supine position. Following induction of general endotracheal anesthesia, the table was turned 90 and the patient was draped in a standard fashion.  A maxillary tooth protector was used throughout the case.  A Jako laryngoscope was used to visualize the larynx and secured to the Mayo stand with the suspension apparatus.  The operating microscope was brought into view and high power was used.  The larynx was inspected and the lesion was identified in the left vocal cord.  It extended from about 1 mm posterior to the anterior commissure to about halfway back along the true vocal cord, did not extend inferior to the contacting surface, and extended to the superior aspect of the true cord without extension into the ventricle.  The left cord was infiltrated with local anesthetic solution using a spinal needle.  Microlaryngoscopy scissors were used to incise the mucosa allowing for at least 1 mm of margin around the entire lesion in all directions.  The entire lesion was removed stripping off of the vocal ligament.  Topical adrenaline was used for hemostasis.  The lesion was sent for pathologic evaluation.    PATIENT DISPOSITION:  To PACU, stable

## 2023-09-15 NOTE — Interval H&P Note (Signed)
 History and Physical Interval Note:  09/15/2023 12:13 PM  Javier Lee  has presented today for surgery, with the diagnosis of Laryngeal mass Laryngopharyngeal reflux LPR.  The various methods of treatment have been discussed with the patient and family. After consideration of risks, benefits and other options for treatment, the patient has consented to  Procedure(s): MICROLARYNGOSCOPY, WITH PROCEDURE USING LASER (N/A) as a surgical intervention.  The patient's history has been reviewed, patient examined, no change in status, stable for surgery.  I have reviewed the patient's chart and labs.  Questions were answered to the patient's satisfaction.     Ida Loader

## 2023-09-15 NOTE — Anesthesia Preprocedure Evaluation (Signed)
 Anesthesia Evaluation  Patient identified by MRN, date of birth, ID band Patient awake    Reviewed: Allergy & Precautions, NPO status , Patient's Chart, lab work & pertinent test results, reviewed documented beta blocker date and time   History of Anesthesia Complications Negative for: history of anesthetic complications  Airway Mallampati: I  TM Distance: >3 FB     Dental no notable dental hx.    Pulmonary neg COPD, former smoker, neg PE   breath sounds clear to auscultation       Cardiovascular hypertension, (-) angina (-) CAD, (-) Past MI and (-) Cardiac Stents  Rhythm:Regular Rate:Normal     Neuro/Psych neg Seizures    GI/Hepatic ,GERD  ,,(+) neg Cirrhosis        Endo/Other    Renal/GU Renal disease     Musculoskeletal   Abdominal   Peds  Hematology   Anesthesia Other Findings   Reproductive/Obstetrics                              Anesthesia Physical Anesthesia Plan  ASA: 2  Anesthesia Plan: General   Post-op Pain Management:    Induction: Intravenous  PONV Risk Score and Plan: 2 and Ondansetron  and Dexamethasone   Airway Management Planned: Oral ETT  Additional Equipment:   Intra-op Plan:   Post-operative Plan: Extubation in OR  Informed Consent: I have reviewed the patients History and Physical, chart, labs and discussed the procedure including the risks, benefits and alternatives for the proposed anesthesia with the patient or authorized representative who has indicated his/her understanding and acceptance.     Dental advisory given  Plan Discussed with: CRNA  Anesthesia Plan Comments:          Anesthesia Quick Evaluation

## 2023-09-15 NOTE — Anesthesia Procedure Notes (Addendum)
 Procedure Name: Intubation Date/Time: 09/15/2023 12:53 PM  Performed by: Donnell Berwyn SQUIBB, CRNAPre-anesthesia Checklist: Patient identified, Emergency Drugs available, Suction available, Patient being monitored and Timeout performed Patient Re-evaluated:Patient Re-evaluated prior to induction Oxygen Delivery Method: Circle system utilized Preoxygenation: Pre-oxygenation with 100% oxygen Induction Type: IV induction Ventilation: Mask ventilation without difficulty Laryngoscope Size: Mac and 3 Grade View: Grade I Tube type: Oral Tube size: 6.0 mm Number of attempts: 1 Airway Equipment and Method: Stylet Placement Confirmation: ETT inserted through vocal cords under direct vision, positive ETCO2 and breath sounds checked- equal and bilateral Secured at: 20 cm Tube secured with: Tape Dental Injury: Teeth and Oropharynx as per pre-operative assessment

## 2023-09-15 NOTE — Transfer of Care (Signed)
 Immediate Anesthesia Transfer of Care Note  Patient: Javier Lee  Procedure(s) Performed: Microlaryngoscopy with excision vocal cord lesion (Left: Throat)  Patient Location: PACU  Anesthesia Type:General  Level of Consciousness: awake, alert , oriented, and patient cooperative  Airway & Oxygen Therapy: Patient Spontanous Breathing and Patient connected to nasal cannula oxygen  Post-op Assessment: Report given to RN and Post -op Vital signs reviewed and stable  Post vital signs: Reviewed and stable  Last Vitals:  Vitals Value Taken Time  BP 147/73 09/15/23 13:45  Temp    Pulse 62 09/15/23 13:46  Resp 14 09/15/23 13:46  SpO2 95 % 09/15/23 13:46  Vitals shown include unfiled device data.  Last Pain:  Vitals:   09/15/23 0945  TempSrc: Temporal  PainSc: 0-No pain      Patients Stated Pain Goal: 3 (09/15/23 0945)  Complications: No notable events documented.

## 2023-09-15 NOTE — H&P (Signed)
 HPI:   Javier Lee is a 73 y.o. male who presents as a new Patient.   Referring Provider: No ref. provider found  Chief complaint: Throat.  HPI: Otherwise healthy gentleman with a several month history of throat drainage, coughing spells, intermittent hoarseness which she has had for a few years. Is actually little better now than it had been. He feels something in the left side of his throat around the tonsil area. He drinks 2 cups of coffee every morning and Coke and wine most days. He was eating a lot of chocolate but recently has cut back. He is on PPI medication for reflux.  PMH/Meds/All/SocHx/FamHx/ROS:   Medical History[1]  Surgical History[2]  No family history of bleeding disorders, wound healing problems or difficulty with anesthesia.     Current Medications[3]  A complete ROS was performed with pertinent positives/negatives noted in the HPI. The remainder of the ROS are negative.   Physical Exam:   BP 144/82 (BP Location: Left arm, Patient Position: Sitting)  Pulse 82  Temp 97.8 F (36.6 C) (Temporal)  Wt 82.1 kg (181 lb)   General: Healthy and alert, in no distress, breathing easily. Normal affect. In a pleasant mood. Head: Normocephalic, atraumatic. No masses, or scars. Eyes: Pupils are equal, and reactive to light. Vision is grossly intact. No spontaneous or gaze nystagmus. Ears: Ear canals are clear. Tympanic membranes are intact, with normal landmarks and the middle ears are clear and healthy. Hearing: Grossly normal. Nose: Nasal cavities are clear with healthy mucosa, no polyps or exudate. Airways are patent. Face: No masses or scars, facial nerve function is symmetric. Oral Cavity: No mucosal abnormalities are noted. Tongue with normal mobility. Dentition appears healthy. Oropharynx: Tonsils are symmetric. There are no mucosal masses identified. Tongue base appears normal and healthy. Larynx/Hypopharynx: Indirect laryngoscopy reveals mobile cords with  a small white cauliflower type lesion along the left anterior vocal cord. No other lesions identified. Chest: Deferred Neck: No palpable masses, no cervical adenopathy, no thyroid nodules or enlargement. Neuro: Cranial nerves II-XII with normal function. Balance: Normal gate. Other findings: none.  Independent Review of Additional Tests or Records:  none  Procedures:  none  Impression & Plans:  Chronic reflux.We discussed causes of reflux, including lifestyle and dietary factors. Recommend strict avoidance of all tobacco, caffeine, alcohol , chocolate and peppermint. A reflux handout with more detailed instructions was provided to the patient.   Vocal cord lesion, recommend microlaryngoscopy with excisional biopsy. We discussed that this could be a small cancer. Will schedule at his convenience.

## 2023-09-16 ENCOUNTER — Encounter (HOSPITAL_BASED_OUTPATIENT_CLINIC_OR_DEPARTMENT_OTHER): Payer: Self-pay | Admitting: Otolaryngology

## 2023-09-16 LAB — SURGICAL PATHOLOGY

## 2023-09-30 NOTE — Progress Notes (Signed)
 Radiation Oncology         [phone] ________________________________  Initial outpatient Consultation  Name: Javier Lee MRN: [id number]  Date: 10/01/2023  DOB: [birth year]  RR:[facility] Jesus Oliphant, MD   REFERRING PHYSICIAN: Jesus Oliphant, MD  DIAGNOSIS: No diagnosis found.   Cancer Staging  No matching staging information was found for the patient.   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 male who presented to Dr. Jesus on 09/04/23 with complains of intermittent hoarseness and globus sensation that has persisted for the prior several weeks. Patient is currently on a PPI medication for reflux management.   During his visit, Dr. Jesus physical exam was significant for mobile cords with a small white cauliflower type lesion along the left anterior vocal cord which he recommended undergoing a microlaryngoscopy with excisional biopsy for further investigation.   Subsequently, the patient underwent a microlaryngoscopy with left excision of vocal cord lesion on 09/15/23 under the care of Dr. Jesus. 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 Based on surgical findings, Dr. Jesus had recommended XRT treatment.    Swallowing issues, if any: ***  Weight Changes: ***  Pain status: ***  Other symptoms: throat drainage, coughing spells, reflux   Tobacco history, if any: None   ETOH abuse, if any: frequently drinks wine along with 2 cups of coffee every morning    Prior cancers, if any: None   PREVIOUS RADIATION THERAPY: No  PAST MEDICAL HISTORY:  has a past medical history of GERD (gastroesophageal reflux disease) and Hypertension.    PAST SURGICAL HISTORY: Past Surgical History:  Procedure Laterality Date   CATARACT EXTRACTION  2016   COLONOSCOPY  2020   EXCISIONAL HEMORRHOIDECTOMY  1990   MICROLARYNGOSCOPY Left 09/15/2023    Procedure: Microlaryngoscopy with excision vocal cord lesion;  Surgeon: Jesus Oliphant, MD;  Location: [facility];  Service: ENT;  Laterality: Left;   Vasectomy  1985    FAMILY HISTORY: family history is not on file.  SOCIAL HISTORY:  reports that he has quit smoking. His smoking use included cigarettes. He has never used smokeless tobacco. He reports current alcohol  use of about 4.0 standard drinks of alcohol  per week. He reports that he does not use drugs.  ALLERGIES: Tricyclic antidepressants  MEDICATIONS:  Current Outpatient Medications  Medication Sig Dispense Refill   Ascorbic Acid (VITAMIN C) 1000 MG tablet Take 1,000 mg by mouth 2 (two) times a week.     Carboxymethylcellulose Sod PF (THERATEARS PF) 0.25 % SOLN Apply 1-2 drops to eye daily as needed.     LORazepam (ATIVAN) 1 MG tablet Take 1 mg by mouth daily as needed.     metoprolol succinate (TOPROL-XL) 50 MG 24 hr tablet Take 25 mg by mouth daily. Take with or immediately following a meal.     olmesartan-hydrochlorothiazide (BENICAR HCT) 20-12.5 MG tablet Take 1 tablet by mouth daily.     omeprazole (PRILOSEC) 20 MG capsule Take 20 mg by mouth daily as needed (indigestion/heart burn).     simvastatin (ZOCOR) 20 MG tablet Take 20 mg by mouth daily.     No current facility-administered medications for this encounter.    REVIEW OF SYSTEMS:  Notable for that above.   PHYSICAL EXAM:  vitals were not taken for this visit.   General: Alert and oriented, in no acute distress HEENT: Head is normocephalic. Extraocular movements are intact. Oropharynx is  notable for ***. Neck: Neck is notable for *** Heart: Regular in rate and rhythm with no murmurs, rubs, or gallops. Chest: Clear to auscultation bilaterally, with no rhonchi, wheezes, or rales. Abdomen: Soft, nontender, nondistended, with no rigidity or guarding. Extremities: No cyanosis or edema. Lymphatics: see Neck Exam Skin: No concerning  lesions. Musculoskeletal: symmetric strength and muscle tone throughout. Neurologic: Cranial nerves II through XII are grossly intact. No obvious focalities. Speech is fluent. Coordination is intact. Psychiatric: Judgment and insight are intact. Affect is appropriate.   ECOG = ***  0 - Asymptomatic (Fully active, able to carry on all predisease activities without restriction)  1 - Symptomatic but completely ambulatory (Restricted in physically strenuous activity but ambulatory and able to carry out work of a light or sedentary nature. For example, light housework, office work)  2 - Symptomatic, <50% in bed during the day (Ambulatory and capable of all self care but unable to carry out any work activities. Up and about more than 50% of waking hours)  3 - Symptomatic, >50% in bed, but not bedbound (Capable of only limited self-care, confined to bed or chair 50% or more of waking hours)  4 - Bedbound (Completely disabled. Cannot carry on any self-care. Totally confined to bed or chair)  5 - Death   Raylene MM, Creech RH, Tormey DC, et al. [phone]). Toxicity and response criteria of the [facility]. Am. DOROTHA Bridges. Oncol. 5 (6): 649-55   LABORATORY DATA:  Lab Results  Component Value Date   WBC 4.8 05/12/2007   HGB 9.3 (L) 05/12/2007   HCT 26.8 (L) 05/12/2007   MCV 87.7 05/12/2007   PLT 184 05/12/2007   CMP     Component Value Date/Time   NA 135 09/15/2023 0936   K 4.0 09/15/2023 0936   CL 100 09/15/2023 0936   CO2 25 09/15/2023 0936   GLUCOSE 88 09/15/2023 0936   BUN 16 09/15/2023 0936   CREATININE 0.99 09/15/2023 0936   CALCIUM 9.3 09/15/2023 0936   GFRNONAA >60 09/15/2023 0936   GFRAA  05/12/2007 0515    >60        The eGFR has been calculated using the MDRD equation. This calculation has not been validated in all clinical      No results found for: TSH   RADIOGRAPHY: No results found.    IMPRESSION/PLAN:  This is a delightful patient with  head and neck cancer. I *** recommend radiotherapy for this patient.  We discussed the potential risks, benefits, and side effects of radiotherapy. We talked in detail about acute and late effects. We discussed that some of the most bothersome acute effects may be mucositis, dysgeusia, salivary changes, skin irritation, hair loss, dehydration, weight loss and fatigue. We talked about late effects which include but are not necessarily limited to dysphagia, hypothyroidism, nerve injury, vascular injury, spinal cord injury, xerostomia, trismus, neck edema, dental issues, non-healing wound, and potentially fatal injury to any of the tissues in the head and neck region. No guarantees of treatment were given. A consent form was signed and placed in the patient's medical record. The patient is enthusiastic about proceeding with treatment. I look forward to participating in the patient's care.    Simulation (treatment planning) will take place ***  We also discussed that the treatment of head and neck cancer is a multidisciplinary process to maximize treatment outcomes and quality of life. For this reason the following referrals have been or will be made:  ***  Medical oncology to discuss chemotherapy   *** Dentistry for dental evaluation, possible extractions in the radiation fields, and /or advice on reducing risk of cavities, osteoradionecrosis, or other oral issues.  *** Nutritionist for nutrition support during and after treatment.  *** Speech language pathology for swallowing and/or speech therapy.  *** Social work for social support.   *** Physical therapy due to risk of lymphedema in neck and deconditioning.  *** Baseline labs including TSH.  On date of service, in total, I spent *** minutes on this encounter. Patient was seen in person.  __________________________________________   Lauraine Golden, MD  This document serves as a record of services personally performed by Lauraine Golden, MD. It  was created on her behalf by Reymundo Cartwright, a trained medical scribe. The creation of this record is based on the scribe's personal observations and the provider's statements to them. This document has been checked and approved by the attending provider.

## 2023-09-30 NOTE — Progress Notes (Signed)
 Oncology Nurse Navigator Documentation   Placed introductory call to new referral patient Javier Lee. I spoke to him and his wife over speaker phone.  Introduced myself as the H&N oncology nurse navigator that works with Dr. Izell to whom he has been referred by Dr. Jesus. They confirmed understanding of referral. Briefly explained my role as his navigator, provided my contact information.  Confirmed understanding of upcoming appts and [facility] location, explained arrival and registration process. I encouraged them to call with questions/concerns as he moves forward with appts and procedures.   They did inform me that he is having dental work done on 8/13 for a recently discovered abscess.  They verbalized understanding of information provided, expressed appreciation for my call.   Navigator Initial Assessment Employment Status: he is retired Currently on [employer] / STD: na Living Situation: he lives with his wife in [place].  Support System: wife PCP:  PCD: Financial Concerns: no Transportation Needs: no Sensory Deficits: no Chiropodist Needed:  no Ambulation Needs: no Psychosocial Needs:  no Concerns/Needs Understanding Cancer:  addressed/answered by navigator to best of ability Self-Expressed Needs: no   Tourist information centre manager, BSN, OCN Head & Neck Oncology Nurse Navigator [facility] at [facility] Phone # [phone]  Fax # [phone]

## 2023-10-01 ENCOUNTER — Encounter: Payer: Self-pay | Admitting: Radiation Oncology

## 2023-10-01 ENCOUNTER — Ambulatory Visit
Admission: RE | Admit: 2023-10-01 | Discharge: 2023-10-01 | Disposition: A | Discharge status: Home / Self Care | Source: Ambulatory Visit | Attending: Radiation Oncology | Admitting: Radiation Oncology

## 2023-10-01 DIAGNOSIS — Z79899 Other long term (current) drug therapy: Secondary | ICD-10-CM | POA: Insufficient documentation

## 2023-10-01 DIAGNOSIS — C320 Malignant neoplasm of glottis: Secondary | ICD-10-CM | POA: Diagnosis not present

## 2023-10-01 DIAGNOSIS — K219 Gastro-esophageal reflux disease without esophagitis: Secondary | ICD-10-CM | POA: Diagnosis not present

## 2023-10-01 DIAGNOSIS — Z87891 Personal history of nicotine dependence: Secondary | ICD-10-CM | POA: Insufficient documentation

## 2023-10-01 DIAGNOSIS — J387 Other diseases of larynx: Secondary | ICD-10-CM

## 2023-10-01 NOTE — Progress Notes (Signed)
 Oncology Nurse Navigator Documentation   Met with patient during initial consult with Javier Lee. He was accompanied by his wife, Javier Lee. Further introduced myself as his/their Navigator, explained my role as a member of the Care Team. Provided New Patient resource guide binder: Contact information for physicians, this navigator, other members of the Care Team Advance Directive information; provided [facility] AD booklet at their request,  Fall Prevention Patient Safety Plan Financial Assistance Information sheet Symptom Management Clinic information [facility] map with highlight of [facility] SLP Information sheet Head and Neck cancer basics Nutrition information Patient and family support information including Spiritual care/Chaplain information, Peer mentor program, health and wellness classes, and the survivorship program Community resources  Assisted with post-consult appt scheduling. I toured him to the [facility] treatment area, explained procedures for lobby registration, arrival to Radiation Waiting, and arrival to treatment area.    They verbalized understanding of information provided. I encouraged them to call with questions/concerns moving forward.  Javier Jefferson, RN, BSN, OCN Head & Neck Oncology Nurse Navigator [facility] [place] [phone]

## 2023-10-01 NOTE — Progress Notes (Signed)
 Head and Neck Cancer Location of Tumor / Histology:  Laryngeal Mass  Patient presented months ago with symptoms of:  Intermittent hoarseness and globus sensation that has been occurring for several weeks.  Biopsies revealed:    Nutrition Status Yes No Comments  Weight changes? []  [x]    Swallowing concerns? []  [x]    PEG? []  [x]     Referrals Yes No Comments  Social Work? [x]  []    Dentistry? [x]  []    Swallowing therapy? [x]  []    Nutrition? [x]  []    Med/Onc? [x]  []     Safety Issues Yes No Comments  Prior radiation? []  [x]    Pacemaker/ICD? []  [x]    Possible current pregnancy? []  [x]    Is the patient on methotrexate? []  [x]     Tobacco/Marijuana/Snuff/ETOH use:  None  Past/Anticipated interventions by otolaryngology, if any:  09/15/2023 Jesus, MD Microlaryngoscopy with Left Excision of Vocal Cord Lesion  Past/Anticipated interventions by medical oncology, if any:  Radiation   Current Complaints / other details:   None

## 2023-10-07 ENCOUNTER — Other Ambulatory Visit: Payer: Self-pay

## 2023-10-07 DIAGNOSIS — C320 Malignant neoplasm of glottis: Secondary | ICD-10-CM

## 2023-10-15 ENCOUNTER — Ambulatory Visit

## 2023-10-15 ENCOUNTER — Ambulatory Visit
Admission: RE | Admit: 2023-10-15 | Discharge: 2023-10-15 | Disposition: A | Discharge status: Home / Self Care | Source: Ambulatory Visit | Attending: Radiation Oncology | Admitting: Radiation Oncology

## 2023-10-15 ENCOUNTER — Other Ambulatory Visit: Payer: Self-pay

## 2023-10-15 DIAGNOSIS — Z510 Encounter for antineoplastic radiation therapy: Secondary | ICD-10-CM | POA: Insufficient documentation

## 2023-10-15 DIAGNOSIS — Z79899 Other long term (current) drug therapy: Secondary | ICD-10-CM | POA: Diagnosis not present

## 2023-10-15 DIAGNOSIS — C320 Malignant neoplasm of glottis: Secondary | ICD-10-CM | POA: Insufficient documentation

## 2023-10-15 DIAGNOSIS — Z87891 Personal history of nicotine dependence: Secondary | ICD-10-CM | POA: Diagnosis not present

## 2023-10-15 LAB — TSH: TSH: 1.91 u[IU]/mL (ref 0.350–4.500)

## 2023-10-15 NOTE — Progress Notes (Signed)
 Oncology Nurse Navigator Documentation   To provide support, encouragement and care continuity, met with Mr. Streight during his CT SIM. He was accompanied by his wife.  He tolerated procedure without difficulty, denied questions/concerns.   I  explained procedures for lobby registration, arrival to Radiation Waiting, and arrival to treatment area.  He voiced understanding.   I encouraged him to call me prior to 10/22/23 [facility].   Delon Jefferson RN, BSN, OCN Head & Neck Oncology Nurse Navigator [facility] at [facility] Phone # [phone]  Fax # [phone]

## 2023-10-17 ENCOUNTER — Inpatient Hospital Stay: Attending: Radiation Oncology

## 2023-10-17 NOTE — Progress Notes (Unsigned)
 [facility] Clinical Social Work  Initial Assessment   Javier Lee is a 73 y.o. year old male contacted caregiver by phone. Clinical Social Work was referred by nurse navigator for assessment of psychosocial needs.   SDOH (Social Determinants of Health) assessments performed: Yes   SDOH Screenings   Food Insecurity: No Food Insecurity (10/17/2023)  Housing: Low Risk  (10/17/2023)  Transportation Needs: No Transportation Needs (10/17/2023)  Utilities: Not At Risk (10/01/2023)  Depression (PHQ2-9): Low Risk  (10/01/2023)  Tobacco Use: Medium Risk (10/01/2023)    PHQ 2/9:    10/01/2023    9:14 AM  Depression screen PHQ 2/9  Decreased Interest 0  Down, Depressed, Hopeless 0  PHQ - 2 Score 0     Distress Screen completed: No     No data to display            Family/Social Information:  Housing Arrangement: patient lives with his spouse.  Family members/support persons in your life? Patient identifies his spouse and his daughter as his support system.  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  Income source: Actor concerns: No Type of concern: None Food access concerns: no Religious or spiritual practice: Yes-patient believes in God but is not apart of church.  Advanced directives: Yes-patient does have a advance directive completed outside of [facility].  Services Currently in place:  Housing, family, transportation   Coping/ Adjustment to diagnosis: Patient understands treatment plan and what happens next? yes Concerns about diagnosis and/or treatment: NO concerns about treatment.  Patient reported stressors: None  Patient enjoys Many hobbies  Current coping skills/ strengths: Ability for insight , Active sense of humor , Average or above average intelligence , Capable of independent living , Communication skills , General fund of knowledge , Motivation for treatment/growth , and  Supportive family/friends     SUMMARY: Current SDOH Barriers:  No SDOH barriers noted  Clinical Social Work Clinical Goal(s):  No clinical social work goals at this time  Interventions: Discussed common feeling and emotions when being diagnosed with cancer, and the importance of support during treatment Informed patient of the support team roles and support services at [facility] Provided CSW contact information and encouraged patient to call with any questions or concerns   Follow Up Plan: Patient will contact CSW with any support or resource needs Patient verbalizes understanding of plan: Yes    Lizbeth Sprague, LCSW Clinical Social Worker [facility]

## 2023-10-21 DIAGNOSIS — Z79899 Other long term (current) drug therapy: Secondary | ICD-10-CM | POA: Diagnosis not present

## 2023-10-21 DIAGNOSIS — C320 Malignant neoplasm of glottis: Secondary | ICD-10-CM | POA: Diagnosis not present

## 2023-10-21 DIAGNOSIS — Z510 Encounter for antineoplastic radiation therapy: Secondary | ICD-10-CM | POA: Diagnosis not present

## 2023-10-21 DIAGNOSIS — Z87891 Personal history of nicotine dependence: Secondary | ICD-10-CM | POA: Diagnosis not present

## 2023-10-22 ENCOUNTER — Ambulatory Visit
Admission: RE | Admit: 2023-10-22 | Discharge: 2023-10-22 | Discharge status: Home / Self Care | Source: Ambulatory Visit | Attending: Radiation Oncology | Admitting: Radiation Oncology

## 2023-10-22 ENCOUNTER — Ambulatory Visit: Admitting: Nutrition

## 2023-10-22 ENCOUNTER — Other Ambulatory Visit: Payer: Self-pay

## 2023-10-22 DIAGNOSIS — C320 Malignant neoplasm of glottis: Secondary | ICD-10-CM | POA: Diagnosis not present

## 2023-10-22 DIAGNOSIS — Z510 Encounter for antineoplastic radiation therapy: Secondary | ICD-10-CM | POA: Diagnosis not present

## 2023-10-22 DIAGNOSIS — Z87891 Personal history of nicotine dependence: Secondary | ICD-10-CM | POA: Diagnosis not present

## 2023-10-22 DIAGNOSIS — Z79899 Other long term (current) drug therapy: Secondary | ICD-10-CM | POA: Diagnosis not present

## 2023-10-22 DIAGNOSIS — R1310 Dysphagia, unspecified: Secondary | ICD-10-CM | POA: Diagnosis not present

## 2023-10-22 LAB — RAD ONC ARIA SESSION SUMMARY
Course Elapsed Days: 0
Plan Fractions Treated to Date: 1
Plan Prescribed Dose Per Fraction: 2.25 Gy
Plan Total Fractions Prescribed: 28
Plan Total Prescribed Dose: 63 Gy
Reference Point Dosage Given to Date: 2.25 Gy
Reference Point Session Dosage Given: 2.25 Gy
Session Number: 1

## 2023-10-22 NOTE — Therapy (Signed)
 OUTPATIENT SPEECH LANGUAGE PATHOLOGY ONCOLOGY EVALUATION   Patient Name: Javier Lee MRN: [id number] DOB:20-May-1950, 73 y.o., male Today's Date: 10/23/2023  PCP: [facility] REFERRING PROVIDER: Izell Domino, MD  END OF SESSION:  End of Session - 10/23/23 1301     Visit Number 1    Number of Visits 3    Date for SLP Re-Evaluation 01/21/24    SLP Start Time 1151    SLP Stop Time  1230    SLP Time Calculation (min) 39 min    Activity Tolerance Patient tolerated treatment well          Past Medical History:  Diagnosis Date   GERD (gastroesophageal reflux disease)    Hypertension    Past Surgical History:  Procedure Laterality Date   CATARACT EXTRACTION  2016   COLONOSCOPY  2020   EXCISIONAL HEMORRHOIDECTOMY  1990   MICROLARYNGOSCOPY Left 09/15/2023   Procedure: Microlaryngoscopy with excision vocal cord lesion;  Surgeon: Jesus Oliphant, MD;  Location: [facility];  Service: ENT;  Laterality: Left;   Vasectomy  1985   Patient Active Problem List   Diagnosis Date Noted   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 He presented to Dr. Jesus on 09/04/23 with complaints of intermittent hoarseness and globus sensation that had persisted for several weeks. Dr. Jesus physical exam was significant for mobile cords with a small white cauliflower type lesion along the left anterior vocal cord which he recommended undergoing a microlaryngoscopy with excisional biopsy for further investigation. 09/15/23 He underwent a microlaryngoscopy with left excision of vocal cord lesion.  Biopsy revealed well differentiated invasive SCC. Dr. Jesus recommended radiation. 10/01/23 Consult with Dr. Izell. She recommends 28 fractions of radiation. Treatment plan:  He will receive 28 fractions of radiation to his glottis which started on 10/22/23 and will complete 11/30/23.  PAIN:  Are you having pain? Yes: NPRS scale: 4/10 Pain location: mouth - dental sx Pain description: sore, tender Aggravating factors: chewing Relieving factors: positioning  FALLS: Has patient fallen in last 6 months?  No  LIVING ENVIRONMENT: Lives with: lives with their spouse Lives in: House/apartment  PLOF:  Level of assistance: Independent with ADLs, Independent with IADLs Employment: Retired  PATIENT GOALS: Maintain WNL swallowing  OBJECTIVE:  Note: Objective measures were completed at Evaluation unless otherwise noted.  INSTRUMENTAL SWALLOW STUDY FINDINGS (MBSS) none performed to date  COGNITION: Overall cognitive status: Within functional limits for tasks assessed  LANGUAGE: Receptive and Expressive language appeared WNL.  ORAL MOTOR EXAMINATION: Overall status: WFL   MOTOR SPEECH: Overall motor speech: Appears intact Level of impairment: Word, Phrase, Sentence, and Conversation Respiration: thoracic breathing and diaphragmatic/abdominal breathing Phonation: hoarse Resonance: WFL Articulation: Appears intact Intelligibility: Intelligible  SUBJECTIVE DYSPHAGIA REPORTS:  Date of onset: none reported at this time Reported symptoms: none noted  Current diet: regular and thin liquids  Co-morbid voice changes: Yes  FACTORS WHICH MAY INCREASE RISK OF ADVERSE EVENT IN PRESENCE OF ASPIRATION:  General health: well appearing  Risk factors: none evident   CLINICAL SWALLOW ASSESSMENT:   Dentition: adequate natural dentition Vocal quality at baseline: hoarse Patient directly observed with  POs: Yes: dysphagia 3 (soft) and thin liquids  Feeding: able to feed self Liquids provided by:  cup Oral phase signs and symptoms: none noted Pharyngeal phase signs and symptoms: none noted                                                                                                                            TREATMENT DATE:   Research states the risk for dysphagia increases due to radiation and/or chemotherapy treatment due to a variety of factors, so SLP educated the pt about the possibility of reduced/limited ability for PO intake during rad tx. SLP also educated pt regarding possible changes to swallowing musculature after rad tx, and why adherence to dysphagia HEP provided today and PO consumption was necessary to inhibit muscle fibrosis following rad tx. SLP informed pt why this would be detrimental to their swallowing status and to their pulmonary health. Pt demonstrated understanding of these things to SLP. SLP encouraged pt to safely eat and drink as deep into their radiation/chemotherapy as possible to provide the best possible long-term swallowing outcome for pt. SLP then developed an individualized HEP for pt involving oral and pharyngeal and vocal  strengthening and ROM and pt was instructed how to perform these exercises, including SLP demonstration. After SLP demonstration, pt return demonstrated each exercise. SLP ensured pt performance was correct prior to educating pt on next exercise. Pt required usual min- mod cues faded to modified independent to perform HEP. Pt was instructed to complete this program 5-7 days/week, at least 20-30 reps a day until 6 months after his last day of rad tx, and then x2 a week after that, indefinitely.   PATIENT EDUCATION: Education details: late effects head/neck radiation on swallow function and HEP procedure Person educated: Patient and Spouse Education method: Explanation, Demonstration, Verbal cues, and Handouts Education comprehension: verbalized understanding, returned demonstration, verbal cues required, and needs further  education   ASSESSMENT:  CLINICAL IMPRESSION: Patient is a 73 y.o. M who was seen today for assessment of swallowing as they undergo radiation/chemoradiation therapy. Today pt ate [place] sandwich and drank thin liquids without overt s/s oral or pharyngeal difficulty. At this time pt swallowing is deemed WNL/WFL with these POs. No oral or overt s/sx pharyngeal deficits, including aspiration were observed. There are no overt s/s aspiration PNA observed by SLP nor any reported by pt at this time. Data indicate that pt's swallow ability will likely decrease over the course of radiation/chemoradiation therapy and could very well decline over time following the conclusion of that therapy due to muscle disuse atrophy and/or muscle fibrosis. Pt will cont to need to be seen by SLP in order to assess safety of PO intake, assess the need for recommending any objective swallow assessment, and ensuring pt is correctly completing the individualized HEP.  OBJECTIVE IMPAIRMENTS: include voice disorder and dysphagia. These impairments are limiting patient from household responsibilities, ADLs/IADLs, effectively communicating at home and in community, and safety when swallowing.  Factors affecting potential to achieve goals and functional outcome are none noted today. Patient will benefit from skilled SLP services to address above impairments and improve overall function.  REHAB POTENTIAL: Good  GOALS: Goals reviewed with patient? No SHORT TERM GOALS: Target: 3rd total session   1. Pt will compelte HEP with modified independence in 2 sessions Baseline: Goal status: INITIAL   2.  pt will tell SLP why pt is completing HEP with modified independence Baseline:  Goal status: INITIAL   3.  pt will describe 3 overt s/s aspiration PNA with modified independence Baseline:  Goal status: INITIAL   4.  pt will tell SLP how a food journal could hasten return to a more normalized diet Baseline:  Goal status: INITIAL      LONG TERM GOALS: Target: 7th total session   1.  pt will complete HEP with independence over two visits Baseline:  Goal status: INITIAL   2.  pt will describe how to modify HEP over time, and the timeline associated with reduction in HEP frequency with modified independence over two sessions Baseline:  Goal status: INITIAL     PLAN:   SLP FREQUENCY:  once approx every 4 weeks   SLP DURATION:  7 sessions   PLANNED INTERVENTIONS: Aspiration precaution training, Pharyngeal strengthening exercises, Diet toleration management , Trials of upgraded texture/liquids, SLP instruction and feedback, Compensatory strategies, [address]26 Treatment of swallowing, and Patient/family education    [facility], CCC-SLP 10/23/2023, 1:02 PM   Referring diagnosis? Glottis carcinoma Treatment diagnosis? (if different than referring diagnosis)  Dysphagia, unspecified type  Hoarseness What was this (referring dx) caused by? []  Surgery []  Fall []  Ongoing issue []  Arthritis [x]  Other: ____Cancer________  Laterality: []  Rt [x]  Lt []  Both  Check all possible CPT codes:  *CHOOSE 10 OR LESS*    See Planned Interventions listed in the Plan section of the Evaluation.

## 2023-10-22 NOTE — Progress Notes (Signed)
 Patient's wife contacted this RD regarding patient's schedule.  She is concerned about the number of appointments that are scheduled and feels it will be difficult for her husband to attend a lot of in person appointments while he is going through radiation treatments.  She requests all nutrition appointments be changed to telephone visits.  I worked with her on acceptable times and made the schedule changes per her request.  She is thinking ahead to both physical therapy and speech therapy appointments and would like to minimize or coordinate these with his radiation times.  I have reached out to [facility], speech therapy, to let him know of her request.  He will work directly with her tomorrow when he sees the patient for his speech therapy evaluation.  She is appreciative of our flexibility.  I encouraged her to contact RD for further nutrition needs.  I have an appointment to discuss patient's nutrition with her tomorrow.

## 2023-10-22 NOTE — Progress Notes (Signed)
 Oncology Nurse Navigator Documentation   Mr. Ehrman presented for his first radiation today and tolerated without difficulty. I will see him tomorrow during head and neck MDC. He and his wife know to call me if he has any questions.    Delon Jefferson RN, BSN, OCN Head & Neck Oncology Nurse Navigator [facility] at [facility] Phone # [phone]  Fax # [phone]

## 2023-10-23 ENCOUNTER — Inpatient Hospital Stay: Admitting: Nutrition

## 2023-10-23 ENCOUNTER — Ambulatory Visit: Attending: Radiation Oncology

## 2023-10-23 ENCOUNTER — Other Ambulatory Visit: Payer: Self-pay

## 2023-10-23 ENCOUNTER — Ambulatory Visit
Admission: RE | Admit: 2023-10-23 | Discharge: 2023-10-23 | Disposition: A | Discharge status: Home / Self Care | Source: Ambulatory Visit | Attending: Radiation Oncology

## 2023-10-23 DIAGNOSIS — R490 Dysphonia: Secondary | ICD-10-CM | POA: Insufficient documentation

## 2023-10-23 DIAGNOSIS — C320 Malignant neoplasm of glottis: Secondary | ICD-10-CM | POA: Insufficient documentation

## 2023-10-23 DIAGNOSIS — Z87891 Personal history of nicotine dependence: Secondary | ICD-10-CM | POA: Diagnosis not present

## 2023-10-23 DIAGNOSIS — Z79899 Other long term (current) drug therapy: Secondary | ICD-10-CM | POA: Diagnosis not present

## 2023-10-23 DIAGNOSIS — R1310 Dysphagia, unspecified: Secondary | ICD-10-CM | POA: Insufficient documentation

## 2023-10-23 DIAGNOSIS — Z510 Encounter for antineoplastic radiation therapy: Secondary | ICD-10-CM | POA: Diagnosis not present

## 2023-10-23 LAB — RAD ONC ARIA SESSION SUMMARY
Course Elapsed Days: 1
Plan Fractions Treated to Date: 2
Plan Prescribed Dose Per Fraction: 2.25 Gy
Plan Total Fractions Prescribed: 28
Plan Total Prescribed Dose: 63 Gy
Reference Point Dosage Given to Date: 4.5 Gy
Reference Point Session Dosage Given: 2.25 Gy
Session Number: 2

## 2023-10-23 NOTE — Progress Notes (Signed)
 Oncology Nurse Navigator Documentation   Mr. Buntrock presented for head and neck MDC today. He is tolerating treatment well at this time. He and his wife know to call me if he has any concerns or questions as he proceeds through treatment.   Delon Jefferson RN, BSN, OCN Head & Neck Oncology Nurse Navigator [facility] at [facility] Phone # [phone]  Fax # [phone]

## 2023-10-23 NOTE — Patient Instructions (Signed)
 SWALLOWING EXERCISES Do these 5-6 days/week until 6 months after your last day of radiation, then 2 days per week afterwards You can use 1-2 drops of liquid to help you swallow, if your mouth gets dry  Effortful Swallows - Press your tongue against the roof of your mouth for 3 seconds, then swallow as hard as you can - Do at least 20 reps/day, in sets of 5-10  Masako Swallow - swallow with your tongue sticking out - Stick tongue out past your lips and gently bite tongue with your teeth - Swallow, while holding your tongue with your teeth - Do at least 20 reps/day, in sets of 5-10    Siren exercise - Stretch your vocal cords by saying oooooooo at a low pitch and then glide up to as high of a pitch as you can - Do at least 20 reps/day

## 2023-10-23 NOTE — Progress Notes (Signed)
 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 Izell.  He does not have a feeding tube.  Past medical history includes GERD, hypertension, and tobacco usage.  Medications include vitamin C, Ativan, and Prilosec.  Labs were reviewed.  Height: 68 inches. Weight: 179 pounds 6 ounces July 30 Patient weighed 182 pounds July 9 BMI: 27.27  Spoke with wife on speaker phone with patient present.  She reports patient has a good appetite and he is eating well.  He currently does not have any nutrition impact symptoms as he has only completed 2 treatments.  He consumes a wide variety of protein containing foods including various meats, yogurt, cottage cheese, eggs with cheese, black beans, smoothies and a 30 g protein shake.  He started using baking soda and salt water gargles yesterday.  He has also tried some pured foods with good success.  They have made great preparations in case patient has difficulty swallowing later on in treatment.  Nutrition diagnosis: Food and nutrition related knowledge deficit related to cancer and associated treatments as evidenced by no prior need for nutrition related information.  Intervention: Educated on importance of small frequent meals and snacks with adequate calories and protein for weight maintenance. Continue high-protein supplement once daily. Continue baking soda and salt water gargle. Modified textures of foods as needed. Will provide nutrition fact sheets for patient to pick up and radiation therapy tomorrow.  Will include contact information for dietitians.  Monitoring, evaluation, goals: Patient will tolerate adequate calories and protein to minimize weight loss.  Next visit: Scheduled with Elvie on August 28 at 315 by telephone.  Patient's wife is aware.  **Disclaimer: This note was dictated with voice recognition software. Similar sounding words can inadvertently be transcribed and this  note may contain transcription errors which may not have been corrected upon publication of note.**

## 2023-10-24 ENCOUNTER — Inpatient Hospital Stay: Admitting: Dietician

## 2023-10-24 ENCOUNTER — Other Ambulatory Visit: Payer: Self-pay

## 2023-10-24 ENCOUNTER — Ambulatory Visit
Admission: RE | Admit: 2023-10-24 | Discharge: 2023-10-24 | Disposition: A | Discharge status: Home / Self Care | Source: Ambulatory Visit | Attending: Radiation Oncology | Admitting: Radiation Oncology

## 2023-10-24 DIAGNOSIS — Z87891 Personal history of nicotine dependence: Secondary | ICD-10-CM | POA: Diagnosis not present

## 2023-10-24 DIAGNOSIS — Z510 Encounter for antineoplastic radiation therapy: Secondary | ICD-10-CM | POA: Diagnosis not present

## 2023-10-24 DIAGNOSIS — Z79899 Other long term (current) drug therapy: Secondary | ICD-10-CM | POA: Diagnosis not present

## 2023-10-24 DIAGNOSIS — C320 Malignant neoplasm of glottis: Secondary | ICD-10-CM | POA: Diagnosis not present

## 2023-10-24 LAB — RAD ONC ARIA SESSION SUMMARY
Course Elapsed Days: 2
Plan Fractions Treated to Date: 3
Plan Prescribed Dose Per Fraction: 2.25 Gy
Plan Total Fractions Prescribed: 28
Plan Total Prescribed Dose: 63 Gy
Reference Point Dosage Given to Date: 6.75 Gy
Reference Point Session Dosage Given: 2.25 Gy
Session Number: 3

## 2023-10-27 ENCOUNTER — Other Ambulatory Visit: Payer: Self-pay

## 2023-10-27 ENCOUNTER — Ambulatory Visit
Admission: RE | Admit: 2023-10-27 | Discharge: 2023-10-27 | Disposition: A | Discharge status: Home / Self Care | Source: Ambulatory Visit | Attending: Radiation Oncology | Admitting: Radiation Oncology

## 2023-10-27 ENCOUNTER — Ambulatory Visit
Admission: RE | Admit: 2023-10-27 | Discharge: 2023-10-27 | Disposition: A | Discharge status: Home / Self Care | Source: Ambulatory Visit | Attending: Radiation Oncology

## 2023-10-27 ENCOUNTER — Other Ambulatory Visit: Payer: Self-pay | Admitting: Radiation Oncology

## 2023-10-27 DIAGNOSIS — Z79899 Other long term (current) drug therapy: Secondary | ICD-10-CM | POA: Diagnosis not present

## 2023-10-27 DIAGNOSIS — Z87891 Personal history of nicotine dependence: Secondary | ICD-10-CM | POA: Diagnosis not present

## 2023-10-27 DIAGNOSIS — C320 Malignant neoplasm of glottis: Secondary | ICD-10-CM

## 2023-10-27 DIAGNOSIS — Z510 Encounter for antineoplastic radiation therapy: Secondary | ICD-10-CM | POA: Diagnosis not present

## 2023-10-27 LAB — RAD ONC ARIA SESSION SUMMARY
Course Elapsed Days: 5
Plan Fractions Treated to Date: 4
Plan Prescribed Dose Per Fraction: 2.25 Gy
Plan Total Fractions Prescribed: 28
Plan Total Prescribed Dose: 63 Gy
Reference Point Dosage Given to Date: 9 Gy
Reference Point Session Dosage Given: 2.25 Gy
Session Number: 4

## 2023-10-27 MED ORDER — LIDOCAINE VISCOUS HCL 2 % MT SOLN: OROMUCOSAL | 3 refills | Status: AC

## 2023-10-28 ENCOUNTER — Other Ambulatory Visit: Payer: Self-pay

## 2023-10-28 ENCOUNTER — Ambulatory Visit
Admission: RE | Admit: 2023-10-28 | Discharge: 2023-10-28 | Disposition: A | Discharge status: Home / Self Care | Source: Ambulatory Visit | Attending: Radiation Oncology | Admitting: Radiation Oncology

## 2023-10-28 DIAGNOSIS — Z510 Encounter for antineoplastic radiation therapy: Secondary | ICD-10-CM | POA: Diagnosis not present

## 2023-10-28 DIAGNOSIS — C320 Malignant neoplasm of glottis: Secondary | ICD-10-CM | POA: Diagnosis not present

## 2023-10-28 DIAGNOSIS — Z87891 Personal history of nicotine dependence: Secondary | ICD-10-CM | POA: Diagnosis not present

## 2023-10-28 DIAGNOSIS — Z79899 Other long term (current) drug therapy: Secondary | ICD-10-CM | POA: Diagnosis not present

## 2023-10-28 LAB — RAD ONC ARIA SESSION SUMMARY
Course Elapsed Days: 6
Plan Fractions Treated to Date: 5
Plan Prescribed Dose Per Fraction: 2.25 Gy
Plan Total Fractions Prescribed: 28
Plan Total Prescribed Dose: 63 Gy
Reference Point Dosage Given to Date: 11.25 Gy
Reference Point Session Dosage Given: 2.25 Gy
Session Number: 5

## 2023-10-29 ENCOUNTER — Other Ambulatory Visit: Payer: Self-pay

## 2023-10-29 ENCOUNTER — Ambulatory Visit
Admission: RE | Admit: 2023-10-29 | Discharge: 2023-10-29 | Disposition: A | Discharge status: Home / Self Care | Source: Ambulatory Visit | Attending: Radiation Oncology

## 2023-10-29 DIAGNOSIS — Z510 Encounter for antineoplastic radiation therapy: Secondary | ICD-10-CM | POA: Diagnosis not present

## 2023-10-29 DIAGNOSIS — Z79899 Other long term (current) drug therapy: Secondary | ICD-10-CM | POA: Diagnosis not present

## 2023-10-29 DIAGNOSIS — C320 Malignant neoplasm of glottis: Secondary | ICD-10-CM | POA: Diagnosis not present

## 2023-10-29 DIAGNOSIS — Z87891 Personal history of nicotine dependence: Secondary | ICD-10-CM | POA: Diagnosis not present

## 2023-10-29 LAB — RAD ONC ARIA SESSION SUMMARY
Course Elapsed Days: 7
Plan Fractions Treated to Date: 6
Plan Prescribed Dose Per Fraction: 2.25 Gy
Plan Total Fractions Prescribed: 28
Plan Total Prescribed Dose: 63 Gy
Reference Point Dosage Given to Date: 13.5 Gy
Reference Point Session Dosage Given: 2.25 Gy
Session Number: 6

## 2023-10-30 ENCOUNTER — Inpatient Hospital Stay: Admitting: Dietician

## 2023-10-30 ENCOUNTER — Ambulatory Visit
Admission: RE | Admit: 2023-10-30 | Discharge: 2023-10-30 | Disposition: A | Discharge status: Home / Self Care | Source: Ambulatory Visit | Attending: Radiation Oncology | Admitting: Radiation Oncology

## 2023-10-30 ENCOUNTER — Encounter: Admitting: Dietician

## 2023-10-30 ENCOUNTER — Other Ambulatory Visit: Payer: Self-pay

## 2023-10-30 ENCOUNTER — Telehealth: Payer: Self-pay | Admitting: Dietician

## 2023-10-30 DIAGNOSIS — Z510 Encounter for antineoplastic radiation therapy: Secondary | ICD-10-CM | POA: Diagnosis not present

## 2023-10-30 DIAGNOSIS — C320 Malignant neoplasm of glottis: Secondary | ICD-10-CM | POA: Diagnosis not present

## 2023-10-30 LAB — RAD ONC ARIA SESSION SUMMARY
Course Elapsed Days: 8
Plan Fractions Treated to Date: 7
Plan Prescribed Dose Per Fraction: 2.25 Gy
Plan Total Fractions Prescribed: 28
Plan Total Prescribed Dose: 63 Gy
Reference Point Dosage Given to Date: 15.75 Gy
Reference Point Session Dosage Given: 2.25 Gy
Session Number: 7

## 2023-10-30 NOTE — Telephone Encounter (Signed)
 Nutrition Follow-up:  Patient with glottis cancer. He is receiving radiation therapy under the care of Dr. Izell.   Spoke with wife of pt via telephone. Patient resting at time of call. She reports pt doing really well so far. His appetite is good. Patient enjoys food. Continues tolerating regular diet without difficulty. Eating a variety of foods (Eggs, cheese, toast, biscuit, watermelon/cantaloupe, beans, sweet potato, pork chop). Wife has prepared soft moist foods to use when needed. He is drinking ~32 ounces of water as well as daily protein shake. Patient is doing baking soda salt water rinses.   Patient is more fatigued and takes a nap in the afternoons. Wife says they walk every morning and park in the lot vs valet.   Medications: reviewed   Labs: no new labs   Anthropometrics: last wt 180.4 on 8/25 - stable   7/30 - 179 lb 6 oz   NUTRITION DIAGNOSIS: Food and nutrition related knowledge deficit improved    INTERVENTION:  Continue strategies for increasing calories and protein with small frequent meals/snacks Continue daily protein shake - will take samples of Ensure to radiation  Continue baking soda salt water gargles    MONITORING, EVALUATION, GOAL: wt trends, intake    NEXT VISIT: Thursday September 4 via telephone

## 2023-10-31 ENCOUNTER — Other Ambulatory Visit: Payer: Self-pay

## 2023-10-31 ENCOUNTER — Ambulatory Visit
Admission: RE | Admit: 2023-10-31 | Discharge: 2023-10-31 | Disposition: A | Discharge status: Home / Self Care | Source: Ambulatory Visit | Attending: Radiation Oncology | Admitting: Radiation Oncology

## 2023-10-31 DIAGNOSIS — C320 Malignant neoplasm of glottis: Secondary | ICD-10-CM | POA: Diagnosis not present

## 2023-10-31 DIAGNOSIS — Z510 Encounter for antineoplastic radiation therapy: Secondary | ICD-10-CM | POA: Diagnosis not present

## 2023-10-31 LAB — RAD ONC ARIA SESSION SUMMARY
Course Elapsed Days: 9
Plan Fractions Treated to Date: 8
Plan Prescribed Dose Per Fraction: 2.25 Gy
Plan Total Fractions Prescribed: 28
Plan Total Prescribed Dose: 63 Gy
Reference Point Dosage Given to Date: 18 Gy
Reference Point Session Dosage Given: 2.25 Gy
Session Number: 8

## 2023-11-04 ENCOUNTER — Ambulatory Visit
Admission: RE | Admit: 2023-11-04 | Discharge: 2023-11-04 | Disposition: A | Discharge status: Home / Self Care | Source: Ambulatory Visit | Attending: Radiation Oncology

## 2023-11-04 ENCOUNTER — Ambulatory Visit
Admission: RE | Admit: 2023-11-04 | Discharge: 2023-11-04 | Disposition: A | Discharge status: Home / Self Care | Source: Ambulatory Visit | Attending: Radiation Oncology | Admitting: Radiation Oncology

## 2023-11-04 ENCOUNTER — Other Ambulatory Visit: Payer: Self-pay

## 2023-11-04 DIAGNOSIS — C320 Malignant neoplasm of glottis: Secondary | ICD-10-CM | POA: Insufficient documentation

## 2023-11-04 DIAGNOSIS — Z510 Encounter for antineoplastic radiation therapy: Secondary | ICD-10-CM | POA: Diagnosis not present

## 2023-11-04 LAB — RAD ONC ARIA SESSION SUMMARY
Course Elapsed Days: 13
Plan Fractions Treated to Date: 9
Plan Prescribed Dose Per Fraction: 2.25 Gy
Plan Total Fractions Prescribed: 28
Plan Total Prescribed Dose: 63 Gy
Reference Point Dosage Given to Date: 20.25 Gy
Reference Point Session Dosage Given: 2.25 Gy
Session Number: 9

## 2023-11-05 ENCOUNTER — Ambulatory Visit
Admission: RE | Admit: 2023-11-05 | Discharge: 2023-11-05 | Disposition: A | Discharge status: Home / Self Care | Source: Ambulatory Visit | Attending: Radiation Oncology

## 2023-11-05 ENCOUNTER — Other Ambulatory Visit: Payer: Self-pay

## 2023-11-05 DIAGNOSIS — Z510 Encounter for antineoplastic radiation therapy: Secondary | ICD-10-CM | POA: Diagnosis not present

## 2023-11-05 DIAGNOSIS — C320 Malignant neoplasm of glottis: Secondary | ICD-10-CM | POA: Diagnosis not present

## 2023-11-05 LAB — RAD ONC ARIA SESSION SUMMARY
Course Elapsed Days: 14
Plan Fractions Treated to Date: 10
Plan Prescribed Dose Per Fraction: 2.25 Gy
Plan Total Fractions Prescribed: 28
Plan Total Prescribed Dose: 63 Gy
Reference Point Dosage Given to Date: 22.5 Gy
Reference Point Session Dosage Given: 2.25 Gy
Session Number: 10

## 2023-11-06 ENCOUNTER — Other Ambulatory Visit: Payer: Self-pay

## 2023-11-06 ENCOUNTER — Telehealth: Payer: Self-pay | Admitting: Dietician

## 2023-11-06 ENCOUNTER — Inpatient Hospital Stay: Attending: Radiation Oncology | Admitting: Dietician

## 2023-11-06 ENCOUNTER — Ambulatory Visit
Admission: RE | Admit: 2023-11-06 | Discharge: 2023-11-06 | Disposition: A | Discharge status: Home / Self Care | Source: Ambulatory Visit | Attending: Radiation Oncology

## 2023-11-06 DIAGNOSIS — Z510 Encounter for antineoplastic radiation therapy: Secondary | ICD-10-CM | POA: Diagnosis not present

## 2023-11-06 DIAGNOSIS — C320 Malignant neoplasm of glottis: Secondary | ICD-10-CM | POA: Diagnosis not present

## 2023-11-06 LAB — RAD ONC ARIA SESSION SUMMARY
Course Elapsed Days: 15
Plan Fractions Treated to Date: 11
Plan Prescribed Dose Per Fraction: 2.25 Gy
Plan Total Fractions Prescribed: 28
Plan Total Prescribed Dose: 63 Gy
Reference Point Dosage Given to Date: 24.75 Gy
Reference Point Session Dosage Given: 2.25 Gy
Session Number: 11

## 2023-11-06 NOTE — Telephone Encounter (Signed)
 Nutrition Follow-up:  Patient with glottis cancer. He is receiving radiation therapy under the care of Dr. Izell.   Spoke with patient and wife via telephone. Doing well overall. Throat is mildly sore which started last night. Patient not needing pain medication at this time. Reports increased fatigue which he relates to blood pressure medications. Daily nap is beneficial.   Appetite is good. Tolerating regular textures without difficulty. Eating pork chop, green beans, sauteed apples at time of call. Had 2 eggs (milk, cottage cheese), fried apples, and a biscuit for breakfast. Patient adding milk and protein drink to cold coffee. He is sipping on water all day long.   Patient and wife continue daily walks.  Bowels moving well. Drinks glass of prune juice at bedtime. Denies nausea or vomiting.   Medications: reviewed   Labs: no new labs   Anthropometrics: Wt 180.8 lb redia) - stable  8/25 - 180.4 lb  7/30 - 179 lb 6 oz   NUTRITION DIAGNOSIS: Food and nutrition related knowledge deficit improved    INTERVENTION:  Continue regular textures as tolerated Encourage high calorie high protein foods for wt maintenance Continue daily protein shake Continue activity as able    MONITORING, EVALUATION, GOAL: wt trends, intake   NEXT VISIT: Thursday September 11 via telephone (pt/wife aware)

## 2023-11-07 ENCOUNTER — Other Ambulatory Visit: Payer: Self-pay

## 2023-11-07 ENCOUNTER — Ambulatory Visit
Admission: RE | Admit: 2023-11-07 | Discharge: 2023-11-07 | Disposition: A | Discharge status: Home / Self Care | Source: Ambulatory Visit | Attending: Radiation Oncology | Admitting: Radiation Oncology

## 2023-11-07 DIAGNOSIS — C320 Malignant neoplasm of glottis: Secondary | ICD-10-CM | POA: Diagnosis not present

## 2023-11-07 DIAGNOSIS — Z510 Encounter for antineoplastic radiation therapy: Secondary | ICD-10-CM | POA: Diagnosis not present

## 2023-11-07 LAB — RAD ONC ARIA SESSION SUMMARY
Course Elapsed Days: 16
Plan Fractions Treated to Date: 12
Plan Prescribed Dose Per Fraction: 2.25 Gy
Plan Total Fractions Prescribed: 28
Plan Total Prescribed Dose: 63 Gy
Reference Point Dosage Given to Date: 27 Gy
Reference Point Session Dosage Given: 2.25 Gy
Session Number: 12

## 2023-11-10 ENCOUNTER — Other Ambulatory Visit: Payer: Self-pay

## 2023-11-10 ENCOUNTER — Ambulatory Visit
Admission: RE | Admit: 2023-11-10 | Discharge: 2023-11-10 | Disposition: A | Discharge status: Home / Self Care | Source: Ambulatory Visit | Attending: Radiation Oncology

## 2023-11-10 DIAGNOSIS — Z510 Encounter for antineoplastic radiation therapy: Secondary | ICD-10-CM | POA: Diagnosis not present

## 2023-11-10 DIAGNOSIS — C320 Malignant neoplasm of glottis: Secondary | ICD-10-CM | POA: Diagnosis not present

## 2023-11-10 LAB — RAD ONC ARIA SESSION SUMMARY
Course Elapsed Days: 19
Plan Fractions Treated to Date: 13
Plan Prescribed Dose Per Fraction: 2.25 Gy
Plan Total Fractions Prescribed: 28
Plan Total Prescribed Dose: 63 Gy
Reference Point Dosage Given to Date: 29.25 Gy
Reference Point Session Dosage Given: 2.25 Gy
Session Number: 13

## 2023-11-10 MED ORDER — SONAFINE EX EMUL
1.0000 | Freq: Two times a day (BID) | CUTANEOUS | Status: DC
  Administered 2023-11-10: 1 via TOPICAL

## 2023-11-11 ENCOUNTER — Other Ambulatory Visit: Payer: Self-pay

## 2023-11-11 ENCOUNTER — Ambulatory Visit
Admission: RE | Admit: 2023-11-11 | Discharge: 2023-11-11 | Disposition: A | Discharge status: Home / Self Care | Source: Ambulatory Visit | Attending: Radiation Oncology | Admitting: Radiation Oncology

## 2023-11-11 DIAGNOSIS — Z510 Encounter for antineoplastic radiation therapy: Secondary | ICD-10-CM | POA: Diagnosis not present

## 2023-11-11 DIAGNOSIS — C320 Malignant neoplasm of glottis: Secondary | ICD-10-CM | POA: Diagnosis not present

## 2023-11-11 LAB — RAD ONC ARIA SESSION SUMMARY
Course Elapsed Days: 20
Plan Fractions Treated to Date: 14
Plan Prescribed Dose Per Fraction: 2.25 Gy
Plan Total Fractions Prescribed: 28
Plan Total Prescribed Dose: 63 Gy
Reference Point Dosage Given to Date: 31.5 Gy
Reference Point Session Dosage Given: 2.25 Gy
Session Number: 14

## 2023-11-12 ENCOUNTER — Other Ambulatory Visit: Payer: Self-pay

## 2023-11-12 ENCOUNTER — Ambulatory Visit
Admission: RE | Admit: 2023-11-12 | Discharge: 2023-11-12 | Disposition: A | Discharge status: Home / Self Care | Source: Ambulatory Visit | Attending: Radiation Oncology

## 2023-11-12 DIAGNOSIS — Z510 Encounter for antineoplastic radiation therapy: Secondary | ICD-10-CM | POA: Diagnosis not present

## 2023-11-12 DIAGNOSIS — C320 Malignant neoplasm of glottis: Secondary | ICD-10-CM | POA: Diagnosis not present

## 2023-11-12 LAB — RAD ONC ARIA SESSION SUMMARY
Course Elapsed Days: 21
Plan Fractions Treated to Date: 15
Plan Prescribed Dose Per Fraction: 2.25 Gy
Plan Total Fractions Prescribed: 28
Plan Total Prescribed Dose: 63 Gy
Reference Point Dosage Given to Date: 33.75 Gy
Reference Point Session Dosage Given: 2.25 Gy
Session Number: 15

## 2023-11-13 ENCOUNTER — Inpatient Hospital Stay: Admitting: Dietician

## 2023-11-13 ENCOUNTER — Telehealth: Payer: Self-pay | Admitting: Dietician

## 2023-11-13 ENCOUNTER — Other Ambulatory Visit: Payer: Self-pay

## 2023-11-13 ENCOUNTER — Ambulatory Visit
Admission: RE | Admit: 2023-11-13 | Discharge: 2023-11-13 | Disposition: A | Discharge status: Home / Self Care | Source: Ambulatory Visit | Attending: Radiation Oncology | Admitting: Radiation Oncology

## 2023-11-13 DIAGNOSIS — C320 Malignant neoplasm of glottis: Secondary | ICD-10-CM | POA: Diagnosis not present

## 2023-11-13 DIAGNOSIS — Z510 Encounter for antineoplastic radiation therapy: Secondary | ICD-10-CM | POA: Diagnosis not present

## 2023-11-13 LAB — RAD ONC ARIA SESSION SUMMARY
Course Elapsed Days: 22
Plan Fractions Treated to Date: 16
Plan Prescribed Dose Per Fraction: 2.25 Gy
Plan Total Fractions Prescribed: 28
Plan Total Prescribed Dose: 63 Gy
Reference Point Dosage Given to Date: 36 Gy
Reference Point Session Dosage Given: 2.25 Gy
Session Number: 16

## 2023-11-13 NOTE — Telephone Encounter (Signed)
 Nutrition Follow-up:  Patient with glottis cancer. He is receiving radiation therapy under the care of Dr. Izell.   Patient continues to have a good appetite. His throat is more sore this week. He has been eating more soft foods even though patient is able to tolerate regular diet with slight discomfort. Patient is using lidocaine  as needed.   Patient drinking water through out the day. He is doing baking soda salt water rinses.   Medications: reviewed   Labs: no new labs  Anthropometrics: Wt 181.4 lb redia) 9/8  9/2 - 180.8 lb 8/25 - 180.4 lb 7/30 - 179 lb 6 oz   NUTRITION DIAGNOSIS: Food and nutrition related knowledge deficit improved   INTERVENTION:  Continue high calorie high protein foods for wt maintenance Continue daily protein shake Continue daily activity as able     MONITORING, EVALUATION, GOAL: wt trends, intake    NEXT VISIT: Thursday September 18 via telephone (pt/wife aware)

## 2023-11-14 ENCOUNTER — Ambulatory Visit
Admission: RE | Admit: 2023-11-14 | Discharge: 2023-11-14 | Disposition: A | Discharge status: Home / Self Care | Source: Ambulatory Visit | Attending: Radiation Oncology | Admitting: Radiation Oncology

## 2023-11-14 ENCOUNTER — Other Ambulatory Visit: Payer: Self-pay

## 2023-11-14 DIAGNOSIS — Z510 Encounter for antineoplastic radiation therapy: Secondary | ICD-10-CM | POA: Diagnosis not present

## 2023-11-14 DIAGNOSIS — C320 Malignant neoplasm of glottis: Secondary | ICD-10-CM | POA: Diagnosis not present

## 2023-11-14 LAB — RAD ONC ARIA SESSION SUMMARY
Course Elapsed Days: 23
Plan Fractions Treated to Date: 17
Plan Prescribed Dose Per Fraction: 2.25 Gy
Plan Total Fractions Prescribed: 28
Plan Total Prescribed Dose: 63 Gy
Reference Point Dosage Given to Date: 38.25 Gy
Reference Point Session Dosage Given: 2.25 Gy
Session Number: 17

## 2023-11-17 ENCOUNTER — Other Ambulatory Visit: Payer: Self-pay

## 2023-11-17 ENCOUNTER — Ambulatory Visit
Admission: RE | Admit: 2023-11-17 | Discharge: 2023-11-17 | Disposition: A | Discharge status: Home / Self Care | Source: Ambulatory Visit | Attending: Radiation Oncology

## 2023-11-17 DIAGNOSIS — Z510 Encounter for antineoplastic radiation therapy: Secondary | ICD-10-CM | POA: Diagnosis not present

## 2023-11-17 DIAGNOSIS — C320 Malignant neoplasm of glottis: Secondary | ICD-10-CM | POA: Diagnosis not present

## 2023-11-17 LAB — RAD ONC ARIA SESSION SUMMARY
Course Elapsed Days: 26
Plan Fractions Treated to Date: 18
Plan Prescribed Dose Per Fraction: 2.25 Gy
Plan Total Fractions Prescribed: 28
Plan Total Prescribed Dose: 63 Gy
Reference Point Dosage Given to Date: 40.5 Gy
Reference Point Session Dosage Given: 2.25 Gy
Session Number: 18

## 2023-11-18 ENCOUNTER — Other Ambulatory Visit (HOSPITAL_COMMUNITY): Payer: Self-pay

## 2023-11-18 ENCOUNTER — Ambulatory Visit
Admission: RE | Admit: 2023-11-18 | Discharge: 2023-11-18 | Disposition: A | Discharge status: Home / Self Care | Source: Ambulatory Visit | Attending: Radiation Oncology

## 2023-11-18 ENCOUNTER — Other Ambulatory Visit: Payer: Self-pay | Admitting: Radiation Oncology

## 2023-11-18 ENCOUNTER — Other Ambulatory Visit: Payer: Self-pay

## 2023-11-18 DIAGNOSIS — C320 Malignant neoplasm of glottis: Secondary | ICD-10-CM

## 2023-11-18 DIAGNOSIS — Z510 Encounter for antineoplastic radiation therapy: Secondary | ICD-10-CM | POA: Diagnosis not present

## 2023-11-18 LAB — RAD ONC ARIA SESSION SUMMARY
Course Elapsed Days: 27
Plan Fractions Treated to Date: 19
Plan Prescribed Dose Per Fraction: 2.25 Gy
Plan Total Fractions Prescribed: 28
Plan Total Prescribed Dose: 63 Gy
Reference Point Dosage Given to Date: 42.75 Gy
Reference Point Session Dosage Given: 2.25 Gy
Session Number: 19

## 2023-11-18 MED ORDER — HYDROCODONE-ACETAMINOPHEN 7.5-325 MG/15ML PO SOLN
10.0000 mL | Freq: Four times a day (QID) | ORAL | 0 refills | Status: AC | PRN
  Filled 2023-11-18: qty 400, 7d supply, fill #0

## 2023-11-18 MED ORDER — HYDROCODONE-ACETAMINOPHEN 7.5-325 MG/15ML PO SOLN: 10.0000 mL | Freq: Four times a day (QID) | ORAL | 0 refills | Status: DC | PRN

## 2023-11-19 ENCOUNTER — Other Ambulatory Visit: Payer: Self-pay

## 2023-11-19 ENCOUNTER — Ambulatory Visit
Admission: RE | Admit: 2023-11-19 | Discharge: 2023-11-19 | Disposition: A | Discharge status: Home / Self Care | Source: Ambulatory Visit | Attending: Radiation Oncology

## 2023-11-19 DIAGNOSIS — C320 Malignant neoplasm of glottis: Secondary | ICD-10-CM | POA: Diagnosis not present

## 2023-11-19 DIAGNOSIS — Z510 Encounter for antineoplastic radiation therapy: Secondary | ICD-10-CM | POA: Diagnosis not present

## 2023-11-19 LAB — RAD ONC ARIA SESSION SUMMARY
Course Elapsed Days: 28
Plan Fractions Treated to Date: 20
Plan Prescribed Dose Per Fraction: 2.25 Gy
Plan Total Fractions Prescribed: 28
Plan Total Prescribed Dose: 63 Gy
Reference Point Dosage Given to Date: 45 Gy
Reference Point Session Dosage Given: 2.25 Gy
Session Number: 20

## 2023-11-20 ENCOUNTER — Ambulatory Visit: Attending: Radiation Oncology

## 2023-11-20 ENCOUNTER — Other Ambulatory Visit: Payer: Self-pay

## 2023-11-20 ENCOUNTER — Telehealth: Payer: Self-pay | Admitting: Dietician

## 2023-11-20 ENCOUNTER — Ambulatory Visit
Admission: RE | Admit: 2023-11-20 | Discharge: 2023-11-20 | Disposition: A | Discharge status: Home / Self Care | Source: Ambulatory Visit | Attending: Radiation Oncology | Admitting: Radiation Oncology

## 2023-11-20 ENCOUNTER — Inpatient Hospital Stay: Admitting: Dietician

## 2023-11-20 DIAGNOSIS — C320 Malignant neoplasm of glottis: Secondary | ICD-10-CM | POA: Diagnosis not present

## 2023-11-20 DIAGNOSIS — R1310 Dysphagia, unspecified: Secondary | ICD-10-CM | POA: Diagnosis not present

## 2023-11-20 DIAGNOSIS — R490 Dysphonia: Secondary | ICD-10-CM | POA: Diagnosis not present

## 2023-11-20 DIAGNOSIS — Z510 Encounter for antineoplastic radiation therapy: Secondary | ICD-10-CM | POA: Diagnosis not present

## 2023-11-20 LAB — RAD ONC ARIA SESSION SUMMARY
Course Elapsed Days: 29
Plan Fractions Treated to Date: 21
Plan Prescribed Dose Per Fraction: 2.25 Gy
Plan Total Fractions Prescribed: 28
Plan Total Prescribed Dose: 63 Gy
Reference Point Dosage Given to Date: 47.25 Gy
Reference Point Session Dosage Given: 2.25 Gy
Session Number: 21

## 2023-11-20 NOTE — Telephone Encounter (Signed)
 Nutrition Follow-up:  Patient with glottis cancer. He is receiving radiation therapy under the care of Dr. Izell. Final radiation planned 9/29  Spoke with patient wife via telephone. Reports increasing side effects this week.  Did well Monday. Sore throat started on Tuesday and started using lidocaine . This is working well. Patient has not needed to use hycet.   Patient has transitioned to soft diet. Recalls  oatmeal, scrambled egg, yogurt for breakfast. Pt ate hamburger steak with gravy, potato, pureed cantaloupe for lunch.   Patient remains active. Continues to walk, but distance is shortened. 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    Medications: reviewed   Labs: no new labs   Anthropometrics: Wt 184.2 lb on 9/15 ([facility]) - trending up  9/8 - 181.4 lb 9/2 - 180.8 lb 8/25 - 180.4 lb 7/30 - 179 lb 6 oz    NUTRITION DIAGNOSIS: Food and nutrition related knowledge deficit improved    INTERVENTION:  Continue high calorie high protein foods for wt maintenance Continue activity as able  9/25 telephone visit cancelled - pt has continued to do well, wife will call with nutrition questions if needed. Will plan to follow-up post treatment  MONITORING, EVALUATION, GOAL: wt trends, intake   NEXT VISIT: To be scheduled with post treatment follow-up

## 2023-11-20 NOTE — Therapy (Signed)
 OUTPATIENT SPEECH LANGUAGE PATHOLOGY ONCOLOGY EVALUATION   Patient Name: Javier Lee MRN: [id number] DOB:[birth year], 73 y.o., male Today's Date: 11/20/2023  PCP: [facility] REFERRING PROVIDER: Izell Domino, MD  END OF SESSION:  End of Session - 11/20/23 0819     Visit Number 2    Number of Visits 4    Date for SLP Re-Evaluation 01/21/24    SLP Start Time 0816    SLP Stop Time  0848    SLP Time Calculation (min) 32 min    Activity Tolerance Patient tolerated treatment well          Past Medical History:  Diagnosis Date   GERD (gastroesophageal reflux disease)    Hypertension    Past Surgical History:  Procedure Laterality Date   CATARACT EXTRACTION  2016   COLONOSCOPY  2020   EXCISIONAL HEMORRHOIDECTOMY  1990   MICROLARYNGOSCOPY Left 09/15/2023   Procedure: Microlaryngoscopy with excision vocal cord lesion;  Surgeon: Jesus Oliphant, MD;  Location: [facility];  Service: ENT;  Laterality: Left;   Vasectomy  1985   Patient Active Problem List   Diagnosis Date Noted   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 He presented to Dr. Jesus on 09/04/23 with complaints of intermittent hoarseness and globus sensation that had persisted for several weeks. Dr. Jesus physical exam was significant for mobile cords with a small white cauliflower type lesion along the left anterior vocal cord which he recommended undergoing a microlaryngoscopy with excisional biopsy for further investigation. 09/15/23 He underwent a microlaryngoscopy with left excision of vocal cord lesion.  Biopsy revealed well differentiated invasive SCC. Dr. Jesus recommended radiation. 10/01/23 Consult with Dr. Izell. She recommends 28 fractions of radiation. Treatment plan:  He will receive 28 fractions of radiation to his glottis which started on 10/22/23 and will complete 11/30/23.  PAIN:  Are you having pain? Yes: NPRS scale: 4/10 Pain location: mouth - throat Pain description: sore, tender Aggravating factors: dryness  Relieving factors: rest  FALLS: Has patient fallen in last 6 months?  No   PATIENT GOALS: Maintain WNL swallowing  OBJECTIVE:  Note: Objective measures were completed at Evaluation unless otherwise noted.                                                                                                                            TREATMENT DATE:   11/20/23: Does lidocaine  before each meal. Oatmeal, cottage cheese, sweet potatoes, pureed beets and meats. PT ate applesauce today and drank water wihtout any overt s/sx oropharyngeal dysphagia. He has competed HEP better prior to this week when  he was having more throat pain. SLP encouraged pt to do what he can of the HEP now and ASAP incr reps to recommended amount. SLP educated pt about food journal and he told SLP rationale for HEP with independence.   10/23/23: Research states the risk for dysphagia increases due to radiation and/or chemotherapy treatment due to a variety of factors, so SLP educated the pt about the possibility of reduced/limited ability for PO intake during rad tx. SLP also educated pt regarding possible changes to swallowing musculature after rad tx, and why adherence to dysphagia HEP provided today and PO consumption was necessary to inhibit muscle fibrosis following rad tx. SLP informed pt why this would be detrimental to their swallowing status and to their pulmonary health. Pt demonstrated understanding of these things to SLP. SLP encouraged pt to safely eat and drink as deep into their radiation/chemotherapy  as possible to provide the best possible long-term swallowing outcome for pt. SLP then developed an individualized HEP for pt involving oral and pharyngeal and vocal  strengthening and ROM and pt was instructed how to perform these exercises, including SLP demonstration. After SLP demonstration, pt return demonstrated each exercise. SLP ensured pt performance was correct prior to educating pt on next exercise. Pt required usual min- mod cues faded to modified independent to perform HEP. Pt was instructed to complete this program 5-7 days/week, at least 20-30 reps a day until 6 months after his last day of rad tx, and then x2 a week after that, indefinitely.   PATIENT EDUCATION: Education details: late effects head/neck radiation on swallow function and HEP procedure Person educated: Patient and Spouse Education method: Explanation, Demonstration, Verbal cues, and Handouts Education comprehension: verbalized understanding, returned demonstration, verbal cues required, and needs further education   ASSESSMENT:  CLINICAL IMPRESSION: Patient is a 73 y.o. M who was seen today for treatment of swallowing as they undergo radiation/chemoradiation therapy. Today pt ate applesauce and drank thin liquids without overt s/s oral or pharyngeal difficulty. At this time pt swallowing is deemed WNL/WFL with these POs. No oral or overt s/sx pharyngeal deficits, including aspiration were observed. There are no overt s/s aspiration PNA observed by SLP nor any reported by pt at this time. Data indicate that pt's swallow ability will likely decrease over the course of radiation/chemoradiation therapy and could very well decline over time following the conclusion of that therapy due to muscle disuse atrophy and/or muscle fibrosis. Pt will cont to need to be seen by SLP in order to assess safety of PO intake, assess the need for recommending any objective swallow assessment, and ensuring pt is correctly completing the  individualized HEP.  OBJECTIVE IMPAIRMENTS: include voice disorder and dysphagia. These impairments are limiting patient from household responsibilities, ADLs/IADLs, effectively communicating at home and in community, and safety when swallowing. Factors affecting potential to achieve goals and functional outcome are none noted today. Patient will benefit from skilled SLP services to address above impairments and improve overall function.  REHAB POTENTIAL: Good  GOALS: Goals reviewed with patient? No SHORT TERM GOALS: Target: 3rd total session   1. Pt will compelte HEP with modified independence in 2 sessions Baseline:  Goal status: INITIAL   2.  pt will tell SLP why pt is completing HEP with modified independence Baseline:  Goal status: met   3.  pt will describe 3 overt s/s aspiration PNA with modified independence Baseline:  Goal status: INITIAL   4.  pt will tell SLP how a food journal  could hasten return to a more normalized diet Baseline:  Goal status: met     LONG TERM GOALS: Target: 7th total session   1.  pt will complete HEP with independence over two visits Baseline:  Goal status: INITIAL   2.  pt will describe how to modify HEP over time, and the timeline associated with reduction in HEP frequency with modified independence over two sessions Baseline:  Goal status: INITIAL     PLAN:   SLP FREQUENCY:  once approx every 4 weeks   SLP DURATION:  7 sessions   PLANNED INTERVENTIONS: Aspiration precaution training, Pharyngeal strengthening exercises, Diet toleration management , Trials of upgraded texture/liquids, SLP instruction and feedback, Compensatory strategies, [address]26 Treatment of swallowing, and Patient/family education    [facility], CCC-SLP 11/20/2023, 9:31 AM   Referring diagnosis? Glottis carcinoma Treatment diagnosis? (if different than referring diagnosis)  Dysphagia, unspecified type  Hoarseness What was this (referring dx) caused by? []   Surgery []  Fall []  Ongoing issue []  Arthritis [x]  Other: ____Cancer________  Laterality: []  Rt [x]  Lt []  Both  Check all possible CPT codes:  *CHOOSE 10 OR LESS*    See Planned Interventions listed in the Plan section of the Evaluation.

## 2023-11-21 ENCOUNTER — Ambulatory Visit
Admission: RE | Admit: 2023-11-21 | Discharge: 2023-11-21 | Disposition: A | Discharge status: Home / Self Care | Source: Ambulatory Visit | Attending: Radiation Oncology | Admitting: Radiation Oncology

## 2023-11-21 ENCOUNTER — Other Ambulatory Visit: Payer: Self-pay

## 2023-11-21 DIAGNOSIS — Z510 Encounter for antineoplastic radiation therapy: Secondary | ICD-10-CM | POA: Diagnosis not present

## 2023-11-21 DIAGNOSIS — C320 Malignant neoplasm of glottis: Secondary | ICD-10-CM | POA: Diagnosis not present

## 2023-11-21 LAB — RAD ONC ARIA SESSION SUMMARY
Course Elapsed Days: 30
Plan Fractions Treated to Date: 22
Plan Prescribed Dose Per Fraction: 2.25 Gy
Plan Total Fractions Prescribed: 28
Plan Total Prescribed Dose: 63 Gy
Reference Point Dosage Given to Date: 49.5 Gy
Reference Point Session Dosage Given: 2.25 Gy
Session Number: 22

## 2023-11-24 ENCOUNTER — Other Ambulatory Visit: Payer: Self-pay

## 2023-11-24 ENCOUNTER — Ambulatory Visit
Admission: RE | Admit: 2023-11-24 | Discharge: 2023-11-24 | Disposition: A | Discharge status: Home / Self Care | Source: Ambulatory Visit | Attending: Radiation Oncology | Admitting: Radiation Oncology

## 2023-11-24 DIAGNOSIS — Z510 Encounter for antineoplastic radiation therapy: Secondary | ICD-10-CM | POA: Diagnosis not present

## 2023-11-24 DIAGNOSIS — C320 Malignant neoplasm of glottis: Secondary | ICD-10-CM | POA: Diagnosis not present

## 2023-11-24 LAB — RAD ONC ARIA SESSION SUMMARY
Course Elapsed Days: 33
Plan Fractions Treated to Date: 23
Plan Prescribed Dose Per Fraction: 2.25 Gy
Plan Total Fractions Prescribed: 28
Plan Total Prescribed Dose: 63 Gy
Reference Point Dosage Given to Date: 51.75 Gy
Reference Point Session Dosage Given: 2.25 Gy
Session Number: 23

## 2023-11-25 ENCOUNTER — Other Ambulatory Visit: Payer: Self-pay

## 2023-11-25 ENCOUNTER — Ambulatory Visit
Admission: RE | Admit: 2023-11-25 | Discharge: 2023-11-25 | Disposition: A | Discharge status: Home / Self Care | Source: Ambulatory Visit | Attending: Radiation Oncology | Admitting: Radiation Oncology

## 2023-11-25 DIAGNOSIS — Z510 Encounter for antineoplastic radiation therapy: Secondary | ICD-10-CM | POA: Diagnosis not present

## 2023-11-25 DIAGNOSIS — C320 Malignant neoplasm of glottis: Secondary | ICD-10-CM | POA: Diagnosis not present

## 2023-11-25 LAB — RAD ONC ARIA SESSION SUMMARY
Course Elapsed Days: 34
Plan Fractions Treated to Date: 24
Plan Prescribed Dose Per Fraction: 2.25 Gy
Plan Total Fractions Prescribed: 28
Plan Total Prescribed Dose: 63 Gy
Reference Point Dosage Given to Date: 54 Gy
Reference Point Session Dosage Given: 2.25 Gy
Session Number: 24

## 2023-11-26 ENCOUNTER — Ambulatory Visit
Admission: RE | Admit: 2023-11-26 | Discharge: 2023-11-26 | Disposition: A | Discharge status: Home / Self Care | Source: Ambulatory Visit | Attending: Radiation Oncology | Admitting: Radiation Oncology

## 2023-11-26 ENCOUNTER — Other Ambulatory Visit: Payer: Self-pay

## 2023-11-26 DIAGNOSIS — C320 Malignant neoplasm of glottis: Secondary | ICD-10-CM | POA: Diagnosis not present

## 2023-11-26 DIAGNOSIS — Z510 Encounter for antineoplastic radiation therapy: Secondary | ICD-10-CM | POA: Diagnosis not present

## 2023-11-26 LAB — RAD ONC ARIA SESSION SUMMARY
Course Elapsed Days: 35
Plan Fractions Treated to Date: 25
Plan Prescribed Dose Per Fraction: 2.25 Gy
Plan Total Fractions Prescribed: 28
Plan Total Prescribed Dose: 63 Gy
Reference Point Dosage Given to Date: 56.25 Gy
Reference Point Session Dosage Given: 2.25 Gy
Session Number: 25

## 2023-11-27 ENCOUNTER — Ambulatory Visit
Admission: RE | Admit: 2023-11-27 | Discharge: 2023-11-27 | Disposition: A | Discharge status: Home / Self Care | Source: Ambulatory Visit | Attending: Radiation Oncology | Admitting: Radiation Oncology

## 2023-11-27 ENCOUNTER — Inpatient Hospital Stay: Admitting: Dietician

## 2023-11-27 ENCOUNTER — Other Ambulatory Visit: Payer: Self-pay

## 2023-11-27 DIAGNOSIS — C320 Malignant neoplasm of glottis: Secondary | ICD-10-CM | POA: Diagnosis not present

## 2023-11-27 DIAGNOSIS — Z510 Encounter for antineoplastic radiation therapy: Secondary | ICD-10-CM | POA: Diagnosis not present

## 2023-11-27 LAB — RAD ONC ARIA SESSION SUMMARY
Course Elapsed Days: 36
Plan Fractions Treated to Date: 26
Plan Prescribed Dose Per Fraction: 2.25 Gy
Plan Total Fractions Prescribed: 28
Plan Total Prescribed Dose: 63 Gy
Reference Point Dosage Given to Date: 58.5 Gy
Reference Point Session Dosage Given: 2.25 Gy
Session Number: 26

## 2023-11-28 ENCOUNTER — Ambulatory Visit
Admission: RE | Admit: 2023-11-28 | Discharge: 2023-11-28 | Disposition: A | Discharge status: Home / Self Care | Source: Ambulatory Visit | Attending: Radiation Oncology | Admitting: Radiation Oncology

## 2023-11-28 ENCOUNTER — Other Ambulatory Visit: Payer: Self-pay

## 2023-11-28 DIAGNOSIS — C320 Malignant neoplasm of glottis: Secondary | ICD-10-CM | POA: Diagnosis not present

## 2023-11-28 DIAGNOSIS — Z510 Encounter for antineoplastic radiation therapy: Secondary | ICD-10-CM | POA: Diagnosis not present

## 2023-11-28 LAB — RAD ONC ARIA SESSION SUMMARY
Course Elapsed Days: 37
Plan Fractions Treated to Date: 27
Plan Prescribed Dose Per Fraction: 2.25 Gy
Plan Total Fractions Prescribed: 28
Plan Total Prescribed Dose: 63 Gy
Reference Point Dosage Given to Date: 60.75 Gy
Reference Point Session Dosage Given: 2.25 Gy
Session Number: 27

## 2023-12-01 ENCOUNTER — Ambulatory Visit
Admission: RE | Admit: 2023-12-01 | Discharge: 2023-12-01 | Disposition: A | Discharge status: Home / Self Care | Source: Ambulatory Visit | Attending: Radiation Oncology | Admitting: Radiation Oncology

## 2023-12-01 ENCOUNTER — Other Ambulatory Visit: Payer: Self-pay

## 2023-12-01 ENCOUNTER — Ambulatory Visit
Admission: RE | Admit: 2023-12-01 | Discharge: 2023-12-01 | Disposition: A | Source: Ambulatory Visit | Attending: Radiation Oncology

## 2023-12-01 DIAGNOSIS — C320 Malignant neoplasm of glottis: Secondary | ICD-10-CM | POA: Diagnosis not present

## 2023-12-01 DIAGNOSIS — Z510 Encounter for antineoplastic radiation therapy: Secondary | ICD-10-CM | POA: Diagnosis not present

## 2023-12-01 LAB — RAD ONC ARIA SESSION SUMMARY
Course Elapsed Days: 40
Plan Fractions Treated to Date: 28
Plan Prescribed Dose Per Fraction: 2.25 Gy
Plan Total Fractions Prescribed: 28
Plan Total Prescribed Dose: 63 Gy
Reference Point Dosage Given to Date: 63 Gy
Reference Point Session Dosage Given: 2.25 Gy
Session Number: 28

## 2023-12-01 NOTE — Progress Notes (Signed)
 Oncology Nurse Navigator Documentation   Met with Javier Lee and his wife after final RT to offer support and to celebrate end of radiation treatment.   Provided verbal post-RT guidance: Importance of keeping all follow-up appts, especially those with Nutrition and SLP. Importance of protecting treatment area from sun. Continuation of Sonafine application 2-3 times daily, application of antibiotic ointment to areas of raw skin; when supply of Sonafine exhausted transition to OTC lotion with vitamin E.  Explained my role as navigator will continue for several more months, encouraged him to call me with needs/concerns.    Delon Jefferson RN, BSN, OCN Head & Neck Oncology Nurse Navigator [facility] at [facility] Phone # [phone]  Fax # [phone]

## 2023-12-02 NOTE — Radiation Completion Notes (Signed)
 Patient Name: Javier Lee, Javier Lee MRN: [id number] Date of Birth: 1950/09/21 Referring Physician: IDA LOADER, M.D. Date of Service: 2023-12-02 Radiation Oncologist: Lauraine Golden, M.D.  [facility]                             RADIATION ONCOLOGY END OF TREATMENT NOTE     Diagnosis: C32.0 Malignant neoplasm of glottis Staging on 2023-10-01: Glottis carcinoma (HCC) T=cT1a, N=cN0, M=cM0 Intent: Curative     ==========DELIVERED PLANS==========  First Treatment Date: 2023-10-22 Last Treatment Date: 2023-12-01   Plan Name: HN_Glottis Site: Larynx Technique: 3D Mode: Photon Dose Per Fraction: 2.25 Gy Prescribed Dose (Delivered / Prescribed): 63 Gy / 63 Gy Prescribed Fxs (Delivered / Prescribed): 28 / 28     ==========ON TREATMENT VISIT DATES========== 2023-10-27, 2023-11-04, 2023-11-10, 2023-11-17, 2023-11-24, 2023-12-01     ==========UPCOMING VISITS========== 12/23/2023 [facility]-RADIATION ONC FOLLOW UP 20 Wyatt Czar M, PA-C        ==========APPENDIX - ON TREATMENT VISIT NOTES==========   See weekly On Treatment Notes in Epic for details in the Media tab (listed as Progress notes on the On Treatment Visit Dates listed above).

## 2023-12-22 ENCOUNTER — Encounter: Payer: Self-pay | Admitting: Radiology

## 2023-12-22 NOTE — Progress Notes (Signed)
  Mr. Javier Lee is here for a follow-up appointment today .  Treatment Completion Date: 12/01/23 Pain issues, if any: Denies  Using a feeding tube?: N/A Weight changes, if any: Denies Swallowing issues, if any: Denies Smoking or chewing tobacco? Denies Using fluoride toothpaste daily? Denies Last ENT visit was on: 09/30/23 with Dr. Ida Loader, MD Other notable issues, if any:   BP 105/75 (BP Location: Left Arm, Patient Position: Sitting, Cuff Size: Large)   Pulse (!) 56   Temp 97.9 F (36.6 C)   Resp 18   Ht 5' 8 (1.727 m)   Wt 180 lb 12.8 oz (82 kg)   SpO2 98%   BMI 27.49 kg/m

## 2023-12-23 ENCOUNTER — Inpatient Hospital Stay: Attending: Radiation Oncology | Admitting: Nutrition

## 2023-12-23 ENCOUNTER — Ambulatory Visit
Admission: RE | Admit: 2023-12-23 | Discharge: 2023-12-23 | Disposition: A | Discharge status: Home / Self Care | Source: Ambulatory Visit | Attending: Radiology | Admitting: Radiology

## 2023-12-23 VITALS — BP 105/75 | HR 56 | Temp 97.9°F | Resp 18 | Ht 68.0 in | Wt 180.8 lb

## 2023-12-23 DIAGNOSIS — Z923 Personal history of irradiation: Secondary | ICD-10-CM | POA: Insufficient documentation

## 2023-12-23 DIAGNOSIS — Z79899 Other long term (current) drug therapy: Secondary | ICD-10-CM | POA: Insufficient documentation

## 2023-12-23 DIAGNOSIS — C320 Malignant neoplasm of glottis: Secondary | ICD-10-CM | POA: Insufficient documentation

## 2023-12-23 NOTE — Progress Notes (Signed)
 Nutrition follow up completed with patient over the phone.  Patient is status post radiation therapy for glottis cancer.  Final radiation therapy completed on September 29.  Weight: 180 pounds 12.8 oz on October 21 184.2 pounds September 15 180.4 pounds August 25  No new labs  Patient reports he is doing better.  He continues to eat a soft diet and has tried to introduce some semisoft foods.  He has been using lidocaine  but has needed it less frequently.  He reports a little fluid buildup which he discussed with provider this morning.  He is going to keep an eye on it and let provider know if he gets any worse.  Nutrition diagnosis: Food and nutrition related knowledge deficit resolved  Encouraged patient to continue strategies for adequate calorie and protein intake for continued healing.  No follow-up scheduled at this time however patient has RD contact information for questions and concerns.  **Disclaimer: This note was dictated with voice recognition software. Similar sounding words can inadvertently be transcribed and this note may contain transcription errors which may not have been corrected upon publication of note.**

## 2023-12-23 NOTE — Progress Notes (Signed)
 Radiation Oncology         [phone] ________________________________  Name: Javier Lee MRN: [id number]  Date: 12/23/2023  DOB: 1950/11/13  Follow-Up Visit Note  CC: [facility]  Diagnosis and Prior Radiotherapy:       ICD-10-CM   1. 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 He completed his treatment approximately 3 weeks ago.   Pain issues, if any: Denies  Using a feeding tube?: N/A Weight changes, if any: Denies Swallowing issues, if any: Denies - continues to eat softer foods.  Smoking or chewing tobacco? Denies Using fluoride toothpaste daily? Denies Last ENT visit was on: 09/30/23 with Dr. Ida Loader, MD                      ALLERGIES:  is allergic to tricyclic antidepressants.  Meds: Current Outpatient Medications  Medication Sig Dispense Refill   Ascorbic Acid (VITAMIN C) 1000 MG tablet Take 1,000 mg by mouth 2 (two) times a week.     Carboxymethylcellulose Sod PF (THERATEARS PF) 0.25 % SOLN Apply 1-2 drops to eye daily as needed.     HYDROcodone -acetaminophen  (HYCET) 7.5-325 mg/15 ml solution Take 10-15 mLs by mouth 4 (four) times daily  as needed for moderate pain (pain score 4-6). Take with food. 400 mL 0   lidocaine  (XYLOCAINE ) 2 % solution Patient: Mix 1part 2% viscous lidocaine , 1part H20. Swallow 10mL of diluted mixture, before meals and at bedtime, up to QID 200 mL 3   LORazepam (ATIVAN) 1 MG tablet Take 1 mg by mouth daily as needed.     metoprolol succinate (TOPROL-XL) 50 MG 24 hr tablet Take 25 mg by mouth daily. Take with or immediately following a meal.     olmesartan-hydrochlorothiazide (BENICAR HCT) 20-12.5 MG tablet Take 1 tablet by mouth daily.     omeprazole (PRILOSEC) 20 MG capsule Take 20 mg by mouth daily as needed (indigestion/heart burn).     simvastatin (ZOCOR) 20 MG tablet Take 20 mg by mouth daily.     No current facility-administered medications for this encounter.    Physical Findings:  The patient is in no acute distress. Patient is alert and oriented. Wt Readings from Last 3 Encounters:  12/23/23 180 lb 12.8 oz (82 kg)  10/01/23 (P) 179 lb 6 oz (81.4 kg)  09/15/23 180 lb 5.4 oz (81.8 kg)    height is 5' 8 (1.727 m) and weight is 180 lb 12.8 oz (82 kg). His temperature is 97.9 F (36.6 C). His blood pressure is 105/75 and his pulse is 56 (abnormal). His respiration is 18 and oxygen saturation is 98%. .  General: Alert and oriented, in no acute distress HEENT: Head is normocephalic.  Extraocular movements are intact. Oropharynx shows no concerning lesions.  Neck: Neck is notable for no palpable adenopathy Skin: Skin in treatment fields shows mild erythema, with no desquamation.  Heart: Regular in rate and rhythm with no murmurs, rubs, or gallops. Chest: Clear to auscultation bilaterally, with no rhonchi, wheezes, or rales. Abdomen: normoactive bowel sounds Extremities: No cyanosis or edema. Lymphatics: see Neck Exam Psychiatric: Judgment and insight are intact. Affect is appropriate.   Lab Findings: Lab Results  Component Value Date   WBC 4.8 05/12/2007   HGB 9.3 (L) 05/12/2007    HCT 26.8 (L) 05/12/2007   MCV 87.7 05/12/2007   PLT 184 05/12/2007    Lab Results  Component Value Date   TSH 1.910 10/15/2023    Radiographic Findings: No results found.  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 He and is wife are pleased with his progress to date.   2) Nutritional Status: Stable, meeting with nutrition later today.  Wt Readings from Last 3 Encounters:  12/23/23 180 lb 12.8 oz (82 kg)  10/01/23 (P) 179 lb 6 oz (81.4 kg)  09/15/23 180 lb 5.4 oz (81.8 kg)  PEG tube: N/A  3) Risk Factors: The patient has been educated about risk factors including alcohol  and tobacco abuse; they understand that avoidance of alcohol  and tobacco is important to prevent recurrences as well as other cancers. He denies tobacco use.   4) Swallowing: Functional  5) Dental: Encouraged to continue regular followup with dentistry, and dental hygiene including fluoride rinses. He reports seeing his dentist regularly.   6) Thyroid  function:  Lab Results  Component Value Date   TSH 1.910 10/15/2023    7) Other: Patient states he was going to be referred to [facility] S[facility], but his has not yet been done. I will reach out to Lupita today to ensure this happens.   8) Patient is scheduled to see Dr. Jesus on 01/27/2024. We will see the patient back in 4 months for routine follow-up. He was encouraged to call with any questions or concerns in the meantime.   On date of service, in total, I spent 30 minutes on this encounter. Patient was seen in person. _____________________________________    Leeroy Due, PA-C

## 2024-01-01 ENCOUNTER — Ambulatory Visit (HOSPITAL_COMMUNITY): Attending: Radiation Oncology | Admitting: Speech Pathology

## 2024-01-01 ENCOUNTER — Encounter (HOSPITAL_COMMUNITY): Payer: Self-pay | Admitting: Speech Pathology

## 2024-01-01 DIAGNOSIS — R1310 Dysphagia, unspecified: Secondary | ICD-10-CM | POA: Diagnosis not present

## 2024-01-01 NOTE — Therapy (Signed)
 OUTPATIENT SPEECH LANGUAGE PATHOLOGY DYSPHAGIA TREATMENT   Patient Name: Javier Lee MRN: [id number] DOB:06-Sep-1950, 73 y.o., male Today's Date: 01/01/2024  PCP: [facility] REFERRING PROVIDER: Izell Domino, MD  END OF SESSION:  End of Session - 01/01/24 1320     Visit Number 3    Number of Visits 4    Date for Recertification  01/21/24    Authorization Type [employer]    SLP Start Time 0900    SLP Stop Time  0945    SLP Time Calculation (min) 45 min    Activity Tolerance Patient tolerated treatment well          Past Medical History:  Diagnosis Date   GERD (gastroesophageal reflux disease)    Hypertension    Past Surgical History:  Procedure Laterality Date   CATARACT EXTRACTION  2016   COLONOSCOPY  2020   EXCISIONAL HEMORRHOIDECTOMY  1990   MICROLARYNGOSCOPY Left 09/15/2023   Procedure: Microlaryngoscopy with excision vocal cord lesion;  Surgeon: Jesus Oliphant, MD;  Location: [facility];  Service: ENT;  Laterality: Left;   Vasectomy  1985   Patient Active Problem List   Diagnosis Date Noted   Glottis carcinoma (HCC) 10/01/2023    ONSET DATE: See below; script  10/15/23  REFERRING DIAG: Glottis carcinoma  THERAPY DIAG:  Dysphagia, unspecified type  Rationale for Evaluation and Treatment: Rehabilitation  SUBJECTIVE:   SUBJECTIVE STATEMENT: I am eating everything.  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 He presented to Dr. Jesus on 09/04/23 with complaints of intermittent hoarseness and globus sensation that had persisted for several weeks. Dr. Jesus physical exam was significant for mobile cords with a small white cauliflower type lesion along the left anterior vocal cord which he recommended undergoing a microlaryngoscopy with excisional biopsy for further investigation. 09/15/23 He underwent a microlaryngoscopy with left excision of vocal cord  lesion. Biopsy revealed well differentiated invasive SCC. Dr. Jesus recommended radiation. 10/01/23 Consult with Dr. Izell. She recommends 28 fractions of radiation. Treatment plan:  He will receive 28 fractions of radiation to his glottis which started on 10/22/23 and will complete 11/30/23.  PAIN:  Are you having pain? No  FALLS: Has patient fallen in last 6 months?  No   PATIENT GOALS: Maintain WNL swallowing  OBJECTIVE:  Note: Objective measures were completed at Evaluation unless otherwise noted.                                                                                                                            TREATMENT DATE:   01/01/24: Pt was accompanied to SLP session by his wife. Pt transferred to [facility] for SLP services due to proximity to his home. He has completed treatment for his glottis cancer and reports that he is eating a variety of foods with varying textures without difficulty. He was eating softer foods  and using lidocaine , however he no longer is doing this and consumes regular textures. Pt consumed regular textures, mech soft, and thin liquids in our session without signs or symptoms of aspiration and no reports of globus. He reports mild to mild/mod xerostomia and carries a water bottle around for sips throughout the day. He reported that he tried chewing sugarless gum, but that it seemed to create too much saliva. Pt was able to return demonstrate previously given swallowing exercises and states that he has been doing them, but not as often as requested. SLP wrote down exercises for him to continue going forward (lingual stretch, oral stretch, masako, CTAR-chin tuck against resistance, and pitch glides) and reiterated the importance of good oral care and continued swallowing exercises to help manage the effects of radiation changes down the road. Pt and wife receptive and appreciative of the information and will continue going forward. Pt can be  discharged from SLP services at this time as he is independent with plan of care and consuming regular textures and thin liquids. He was given my contact information (phone and email) should he have any further questions of concerns going forward. He was asked to monitor for any changes and reach out to his doctor should he need to.   11/20/23: Does lidocaine  before each meal. Oatmeal, cottage cheese, sweet potatoes, pureed beets and meats. PT ate applesauce today and drank water wihtout any overt s/sx oropharyngeal dysphagia. He has competed HEP better prior to this week when he was having more throat pain. SLP encouraged pt to do what he can of the HEP now and ASAP incr reps to recommended amount. SLP educated pt about food journal and he told SLP rationale for HEP with independence.   10/23/23: Research states the risk for dysphagia increases due to radiation and/or chemotherapy treatment due to a variety of factors, so SLP educated the pt about the possibility of reduced/limited ability for PO intake during rad tx. SLP also educated pt regarding possible changes to swallowing musculature after rad tx, and why adherence to dysphagia HEP provided today and PO consumption was necessary to inhibit muscle fibrosis following rad tx. SLP informed pt why this would be detrimental to their swallowing status and to their pulmonary health. Pt demonstrated understanding of these things to SLP. SLP encouraged pt to safely eat and drink as deep into their radiation/chemotherapy as possible to provide the best possible long-term swallowing outcome for pt. SLP then developed an individualized HEP for pt involving oral and pharyngeal and vocal  strengthening and ROM and pt was instructed how to perform these exercises, including SLP demonstration. After SLP demonstration, pt return demonstrated each exercise. SLP ensured pt performance was correct prior to educating pt on next exercise. Pt required usual min- mod cues faded  to modified independent to perform HEP. Pt was instructed to complete this program 5-7 days/week, at least 20-30 reps a day until 6 months after his last day of rad tx, and then x2 a week after that, indefinitely.   PATIENT EDUCATION: Education details: late effects head/neck radiation on swallow function and HEP procedure Person educated: Patient and Spouse Education method: Explanation, Demonstration, Verbal cues, and Handouts Education comprehension: verbalized understanding, returned demonstration, verbal cues required, and needs further education   ASSESSMENT:  CLINICAL IMPRESSION: (from evaluation) Patient is a 73 y.o. M who was seen today for treatment of swallowing as they undergo radiation/chemoradiation therapy. Today pt ate applesauce and drank thin liquids without overt s/s oral or pharyngeal  difficulty. At this time pt swallowing is deemed WNL/WFL with these POs. No oral or overt s/sx pharyngeal deficits, including aspiration were observed. There are no overt s/s aspiration PNA observed by SLP nor any reported by pt at this time. Data indicate that pt's swallow ability will likely decrease over the course of radiation/chemoradiation therapy and could very well decline over time following the conclusion of that therapy due to muscle disuse atrophy and/or muscle fibrosis. Pt will cont to need to be seen by SLP in order to assess safety of PO intake, assess the need for recommending any objective swallow assessment, and ensuring pt is correctly completing the individualized HEP.  OBJECTIVE IMPAIRMENTS: include voice disorder and dysphagia. These impairments are limiting patient from household responsibilities, ADLs/IADLs, effectively communicating at home and in community, and safety when swallowing. Factors affecting potential to achieve goals and functional outcome are none noted today. Patient will benefit from skilled SLP services to address above impairments and improve overall  function.  REHAB POTENTIAL: Good  GOALS: Goals reviewed with patient? No SHORT TERM GOALS: Target: 3rd total session   1. Pt will compelte HEP with modified independence in 2 sessions Baseline:  Goal status: MET   2.  pt will tell SLP why pt is completing HEP with modified independence Baseline:  Goal status: MET   3.  pt will describe 3 overt s/s aspiration PNA with modified independence Baseline:  Goal status: MET   4.  pt will tell SLP how a food journal could hasten return to a more normalized diet Baseline:  Goal status: met     LONG TERM GOALS: Target: 7th total session   1.  pt will complete HEP with independence over two visits Baseline:  Goal status: MET   2.  pt will describe how to modify HEP over time, and the timeline associated with reduction in HEP frequency with modified independence over two sessions Baseline:  Goal status: MET     PLAN:   SLP FREQUENCY:  D/C from SLP services   PLANNED INTERVENTIONS: Aspiration precaution training, Pharyngeal strengthening exercises, Diet toleration management , Trials of upgraded texture/liquids, SLP instruction and feedback, Compensatory strategies, [id number] Treatment of swallowing, and Patient/family education   Thank you,  Lamar Candy, CCC-SLP [phone]  Amzie Sillas, CCC-SLP 01/01/2024, 1:22 PM  SPEECH THERAPY DISCHARGE SUMMARY  Visits from Start of Care: 3  Current functional level related to goals / functional outcomes: Goal met, see above   Remaining deficits: N/A   Education / Equipment: Pt to continue with swallowing exercises going forward.   Patient agrees to discharge. Patient goals were met. Patient is being discharged due to being pleased with the current functional level.SABRA

## 2024-01-06 DIAGNOSIS — H35372 Puckering of macula, left eye: Secondary | ICD-10-CM | POA: Diagnosis not present

## 2024-01-06 DIAGNOSIS — H35342 Macular cyst, hole, or pseudohole, left eye: Secondary | ICD-10-CM | POA: Diagnosis not present

## 2024-01-06 DIAGNOSIS — H43811 Vitreous degeneration, right eye: Secondary | ICD-10-CM | POA: Diagnosis not present

## 2024-01-06 DIAGNOSIS — H43391 Other vitreous opacities, right eye: Secondary | ICD-10-CM | POA: Diagnosis not present

## 2024-01-27 DIAGNOSIS — C329 Malignant neoplasm of larynx, unspecified: Secondary | ICD-10-CM | POA: Diagnosis not present

## 2024-01-27 DIAGNOSIS — Z923 Personal history of irradiation: Secondary | ICD-10-CM | POA: Diagnosis not present

## 2024-04-27 ENCOUNTER — Ambulatory Visit: Admitting: Radiology

## 2024-04-27 ENCOUNTER — Ambulatory Visit: Admitting: Radiation Oncology

## 2024-05-25 ENCOUNTER — Ambulatory Visit: Admitting: Radiation Oncology
